# Patient Record
Sex: Female | Born: 1985 | Race: White | Hispanic: No | Marital: Married | Smoking: Former smoker
Health system: Southern US, Community
[De-identification: ages and names within clinical notes are randomized; demographics above are authoritative.]

## PROBLEM LIST (undated history)

## (undated) DIAGNOSIS — Z789 Other specified health status: Secondary | ICD-10-CM

## (undated) DIAGNOSIS — L0591 Pilonidal cyst without abscess: Secondary | ICD-10-CM

## (undated) HISTORY — PX: NO PAST SURGERIES: SHX2092

## (undated) HISTORY — DX: Pilonidal cyst without abscess: L05.91

## (undated) HISTORY — PX: OTHER SURGICAL HISTORY: SHX169

---

## 2009-08-26 ENCOUNTER — Inpatient Hospital Stay: Payer: Self-pay

## 2013-02-09 ENCOUNTER — Ambulatory Visit: Payer: Self-pay | Admitting: Emergency Medicine

## 2013-02-10 ENCOUNTER — Ambulatory Visit: Payer: Self-pay | Admitting: Emergency Medicine

## 2013-06-08 ENCOUNTER — Ambulatory Visit: Payer: Self-pay | Admitting: Physician Assistant

## 2014-01-01 IMAGING — US US SOFT TISSUE EXCLUDE HEAD/NECK
1 series · 14 of 25 positions shown · non-contrast
Comparison: none

REASON FOR EXAM: PALPABLE MASS IN RLQ CAN BE FELT
COMMENTS:

[Series 1: us soft tissue exclude head/neck · 0.08mm/px · 14 of 39 slices shown]
[im 1/39]
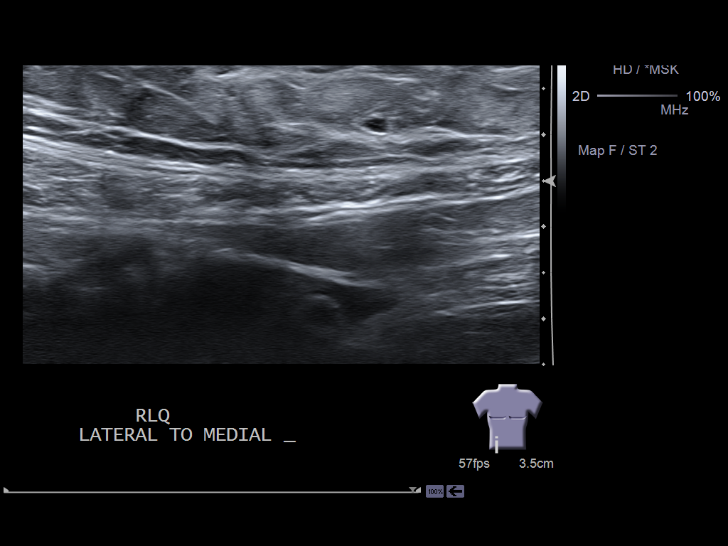
[im 4/39]
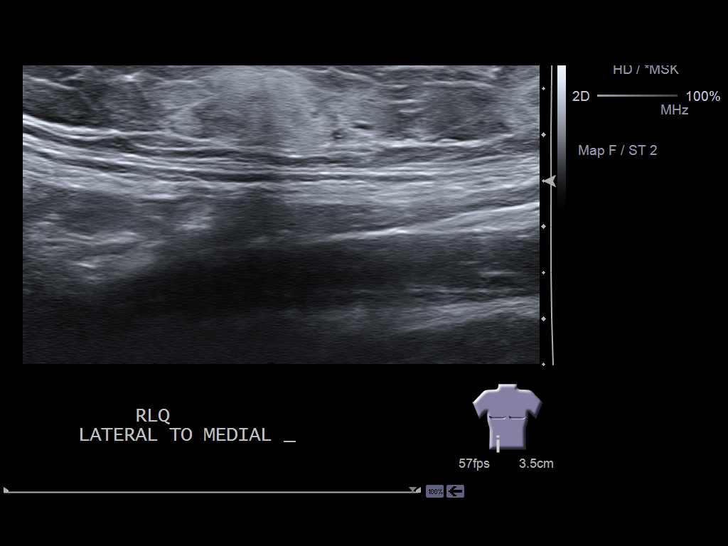
[im 7/39]
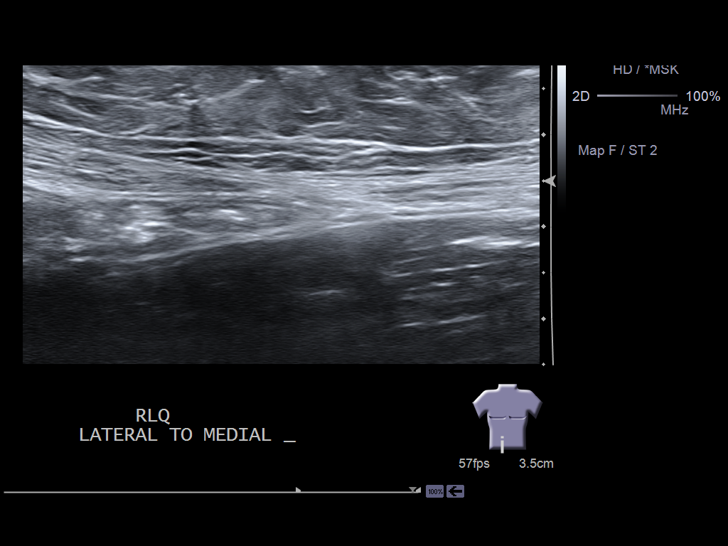
[im 10/39]
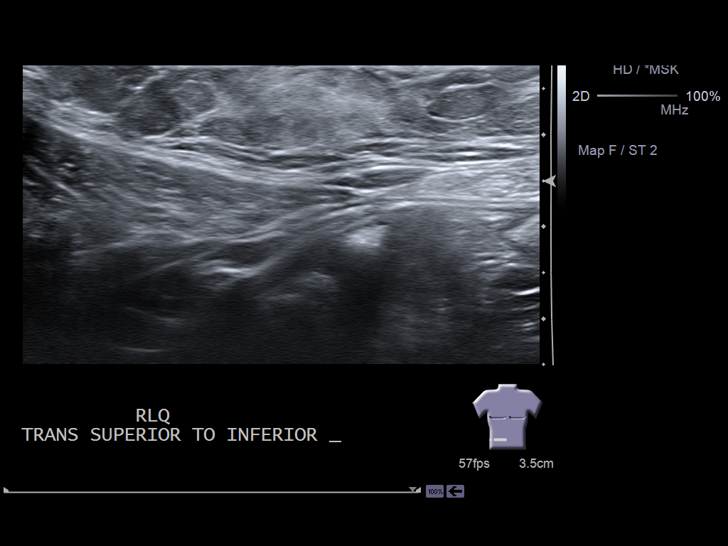
[im 13/39]
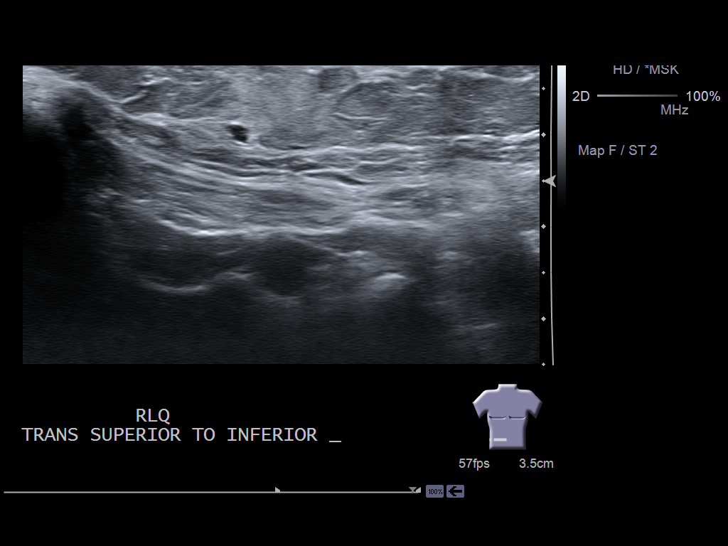
[im 15/39]
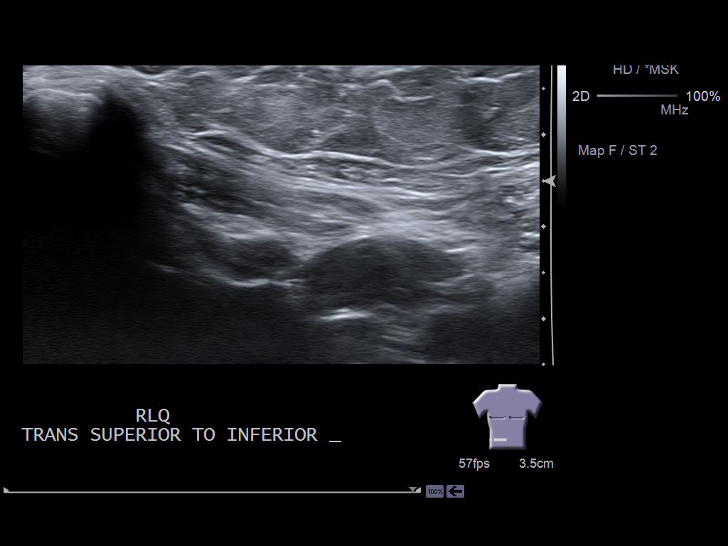
[im 18/39]
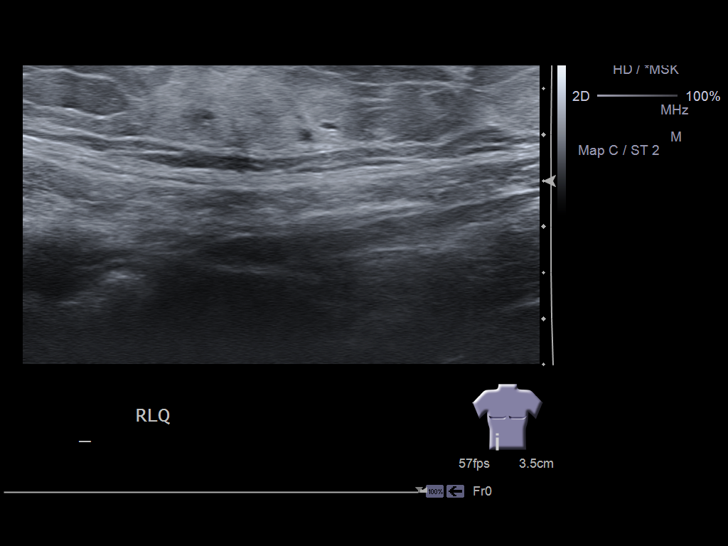
[im 21/39]
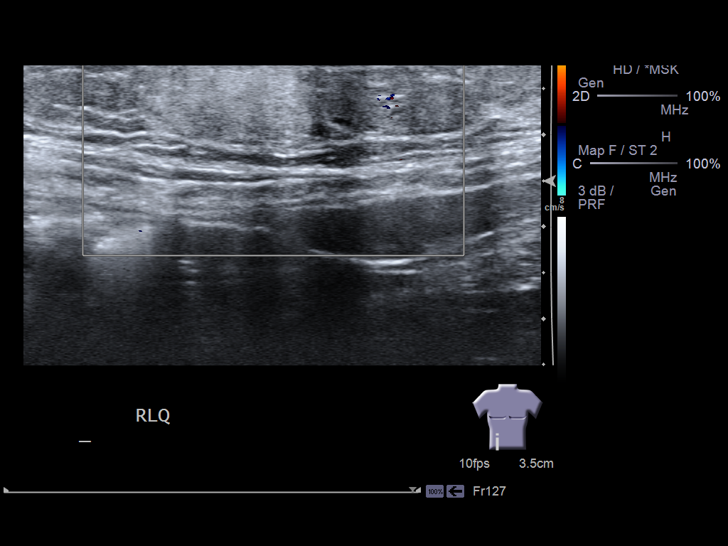
[im 24/39]
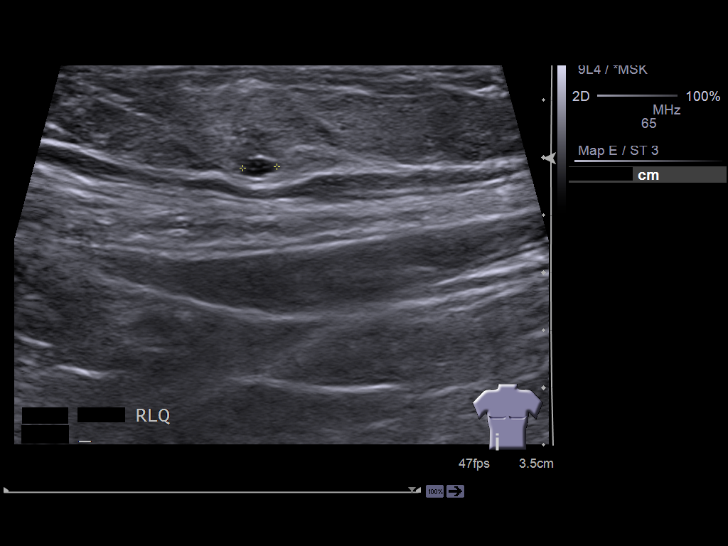
[im 26/39]
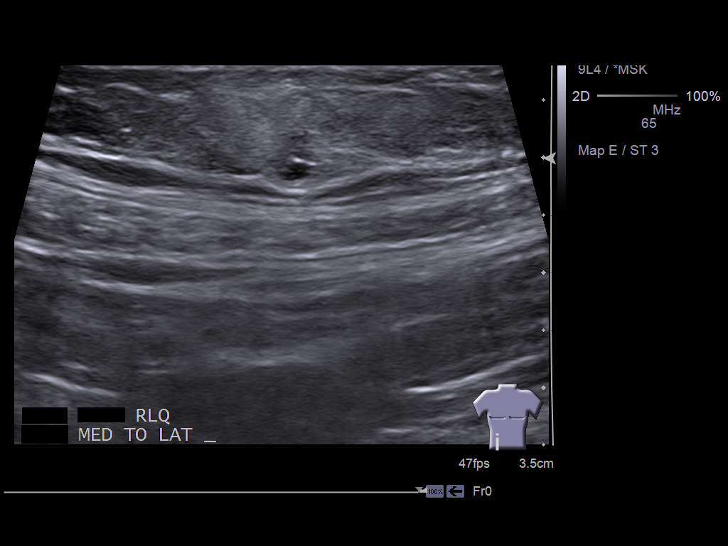
[im 29/39]
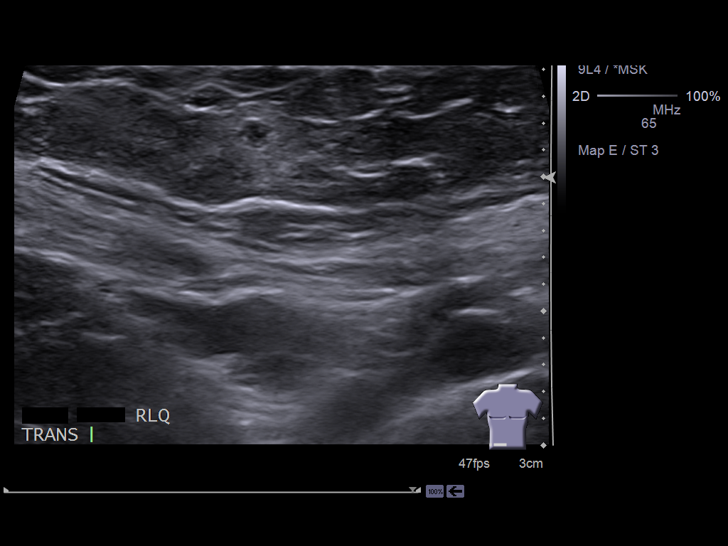
[im 32/39]
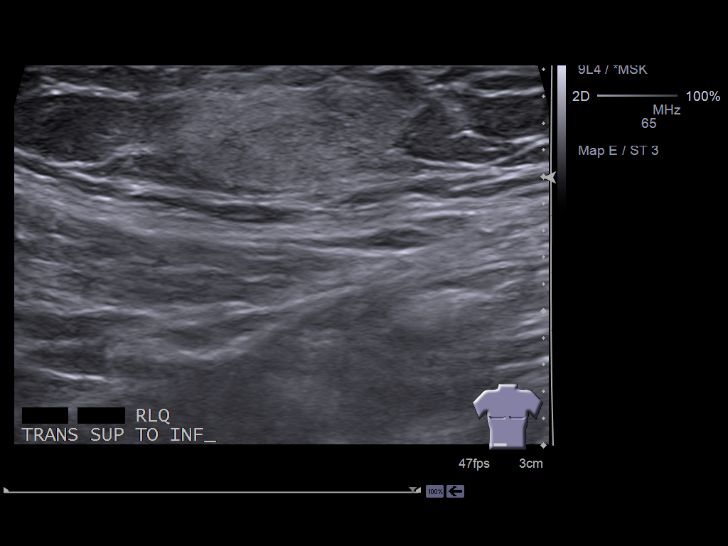
[im 35/39]
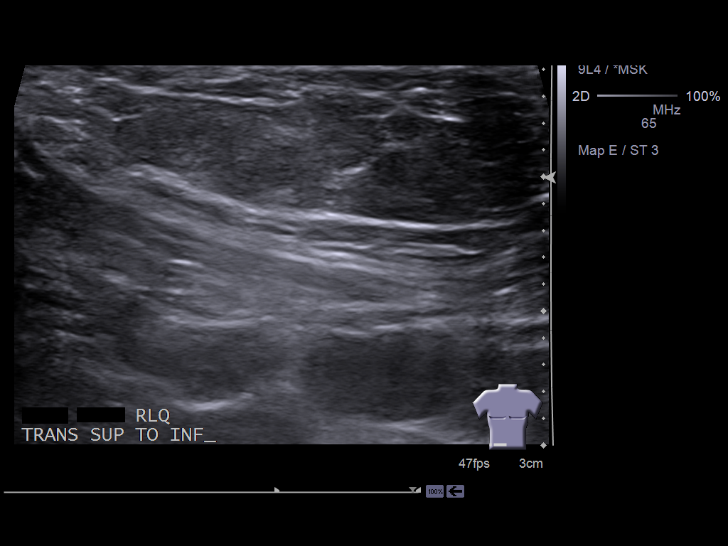
[im 39/39]
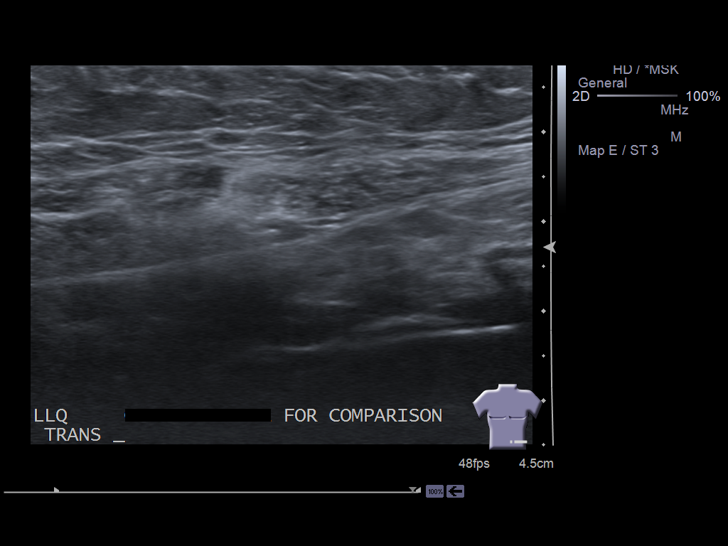

[14 of 25 positions shown; findings below may reference images not displayed]

PROCEDURE:     JIM - JIM SOFT TISSUE NOT HEAD/NECK  - February 10, 2013  [DATE]

RESULT:     Targeted ultrasound in the probable area of abnormality in the
right lower quadrant medial to the anterior/superior iliac spines shows a
poorly defined area of minimally increased echotexture in the subcutaneous
tissues with some small hypoechoic appearance within it. This hypoechoic
area measures 3.0 x 2.3 x 2.1 mm. There is no evidence of hyperemia. Other
smaller rounded hypoechoic areas are seen.
IMPRESSION: Nonspecific area of atypical echotexture with some small
round hypoechoic structures within it. No blood flow or hyperemia in this
structure to any significant degree. The appearance is nonspecific.

[REDACTED]

## 2016-07-30 NOTE — L&D Delivery Note (Signed)
Delivery Note At 3:51 AM a viable child was delivered via Vaginal, Spontaneous (Presentation:ROA).  APGAR: 8, 9; weight pending.   Placenta status: spontaneous, intact.  Cord: 3VC, nuchal cord x 1, true knot x 1 without complications.  Cord pH: N/A  Anesthesia: none Episiotomy:  none Lacerations:  Small hemostatic anterior labia minora Suture Repair: none Est. Blood Loss (mL):  400mL  Mom to postpartum.  Baby to Couplet care / Skin to Skin.  Katherine Hansen 07/09/2017, 4:01 AM

## 2016-11-19 ENCOUNTER — Encounter: Payer: Self-pay | Admitting: Certified Nurse Midwife

## 2016-11-19 ENCOUNTER — Ambulatory Visit (INDEPENDENT_AMBULATORY_CARE_PROVIDER_SITE_OTHER): Payer: Self-pay | Admitting: Certified Nurse Midwife

## 2016-11-19 VITALS — BP 105/64 | HR 81 | Ht 64.0 in | Wt 141.3 lb

## 2016-11-19 DIAGNOSIS — N912 Amenorrhea, unspecified: Secondary | ICD-10-CM

## 2016-11-19 DIAGNOSIS — Z3201 Encounter for pregnancy test, result positive: Secondary | ICD-10-CM

## 2016-11-19 DIAGNOSIS — Z3A01 Less than 8 weeks gestation of pregnancy: Secondary | ICD-10-CM

## 2016-11-19 DIAGNOSIS — Z3490 Encounter for supervision of normal pregnancy, unspecified, unspecified trimester: Secondary | ICD-10-CM | POA: Insufficient documentation

## 2016-11-19 LAB — POCT URINE PREGNANCY: Preg Test, Ur: POSITIVE — AB

## 2016-11-19 NOTE — Progress Notes (Signed)
Subjective:    Katherine Hansen is a 31 y.o. female who presents for evaluation of amenorrhea. She believes she could be pregnant. Pregnancy is desired. Sexual Activity: single partner, contraception: none. Current symptoms also include: morning sickness and nausea. Last period was normal.   Patient's last menstrual period was 09/28/2016 (exact date). Has hx of irregular periods.  The following portions of the patient's history were reviewed and updated as appropriate: allergies, current medications, past family history, past medical history, past social history, past surgical history and problem list.  Review of Systems Pertinent items are noted in HPI.     Objective:    BP 105/64 (BP Location: Left Arm, Patient Position: Sitting, Cuff Size: Normal)   Pulse 81   Ht  (1.626 m)   Wt 141 lb 4.8 oz (64.1 kg)   LMP 09/28/2016 (Exact Date)   BMI 24.25 kg/m  General: alert, cooperative, appears stated age and no acute distress    Lab Review Urine HCG: positive    Assessment:    Absence of menstruation.     Plan:    Pregnancy Test:   Positive: EDC: 07/05/17. Briefly discussed pre-natal care options. Encouraged well-balanced diet, plenty of rest when needed, pre-natal vitamins daily and walking for exercise. Discussed self-help for nausea, avoiding OTC medications until consulting provider or pharmacist, other than Tylenol as needed, minimal caffeine (1-2 cups daily) and avoiding alcohol. She will schedule and ultrasound for dating due to hx of irregular cycles. Her nurse visit in 3 wks. Her initial OB visit @ 12 wks. . Feel free to call with any questions.

## 2016-11-19 NOTE — Patient Instructions (Signed)
First Trimester of Pregnancy The first trimester of pregnancy is from week 1 until the end of week 13 (months 1 through 3). During this time, your baby will begin to develop inside you. At 6-8 weeks, the eyes and face are formed, and the heartbeat can be seen on ultrasound. At the end of 12 weeks, all the baby's organs are formed. Prenatal care is all the medical care you receive before the birth of your baby. Make sure you get good prenatal care and follow all of your doctor's instructions. Follow these instructions at home: Medicines  Take over-the-counter and prescription medicines only as told by your doctor. Some medicines are safe and some medicines are not safe during pregnancy.  Take a prenatal vitamin that contains at least 600 micrograms (mcg) of folic acid.  If you have trouble pooping (constipation), take medicine that will make your stool soft (stool softener) if your doctor approves. Eating and drinking  Eat regular, healthy meals.  Your doctor will tell you the amount of weight gain that is right for you.  Avoid raw meat and uncooked cheese.  If you feel sick to your stomach (nauseous) or throw up (vomit): ? Eat 4 or 5 small meals a day instead of 3 large meals. ? Try eating a few soda crackers. ? Drink liquids between meals instead of during meals.  To prevent constipation: ? Eat foods that are high in fiber, like fresh fruits and vegetables, whole grains, and beans. ? Drink enough fluids to keep your pee (urine) clear or pale yellow. Activity  Exercise only as told by your doctor. Stop exercising if you have cramps or pain in your lower belly (abdomen) or low back.  Do not exercise if it is too hot, too humid, or if you are in a place of great height (high altitude).  Try to avoid standing for long periods of time. Move your legs often if you must stand in one place for a long time.  Avoid heavy lifting.  Wear low-heeled shoes. Sit and stand up straight.  You  can have sex unless your doctor tells you not to. Relieving pain and discomfort  Wear a good support bra if your breasts are sore.  Take warm water baths (sitz baths) to soothe pain or discomfort caused by hemorrhoids. Use hemorrhoid cream if your doctor says it is okay.  Rest with your legs raised if you have leg cramps or low back pain.  If you have puffy, bulging veins (varicose veins) in your legs: ? Wear support hose or compression stockings as told by your doctor. ? Raise (elevate) your feet for 15 minutes, 3-4 times a day. ? Limit salt in your food. Prenatal care  Schedule your prenatal visits by the twelfth week of pregnancy.  Write down your questions. Take them to your prenatal visits.  Keep all your prenatal visits as told by your doctor. This is important. Safety  Wear your seat belt at all times when driving.  Make a list of emergency phone numbers. The list should include numbers for family, friends, the hospital, and police and fire departments. General instructions  Ask your doctor for a referral to a local prenatal class. Begin classes no later than at the start of month 6 of your pregnancy.  Ask for help if you need counseling or if you need help with nutrition. Your doctor can give you advice or tell you where to go for help.  Do not use hot tubs, steam rooms, or   saunas.  Do not douche or use tampons or scented sanitary pads.  Do not cross your legs for long periods of time.  Avoid all herbs and alcohol. Avoid drugs that are not approved by your doctor.  Do not use any tobacco products, including cigarettes, chewing tobacco, and electronic cigarettes. If you need help quitting, ask your doctor. You may get counseling or other support to help you quit.  Avoid cat litter boxes and soil used by cats. These carry germs that can cause birth defects in the baby and can cause a loss of your baby (miscarriage) or stillbirth.  Visit your dentist. At home, brush  your teeth with a soft toothbrush. Be gentle when you floss. Contact a doctor if:  You are dizzy.  You have mild cramps or pressure in your lower belly.  You have a nagging pain in your belly area.  You continue to feel sick to your stomach, you throw up, or you have watery poop (diarrhea).  You have a bad smelling fluid coming from your vagina.  You have pain when you pee (urinate).  You have increased puffiness (swelling) in your face, hands, legs, or ankles. Get help right away if:  You have a fever.  You are leaking fluid from your vagina.  You have spotting or bleeding from your vagina.  You have very bad belly cramping or pain.  You gain or lose weight rapidly.  You throw up blood. It may look like coffee grounds.  You are around people who have German measles, fifth disease, or chickenpox.  You have a very bad headache.  You have shortness of breath.  You have any kind of trauma, such as from a fall or a car accident. Summary  The first trimester of pregnancy is from week 1 until the end of week 13 (months 1 through 3).  To take care of yourself and your unborn baby, you will need to eat healthy meals, take medicines only if your doctor tells you to do so, and do activities that are safe for you and your baby.  Keep all follow-up visits as told by your doctor. This is important as your doctor will have to ensure that your baby is healthy and growing well. This information is not intended to replace advice given to you by your health care provider. Make sure you discuss any questions you have with your health care provider. Document Released: 01/02/2008 Document Revised: 07/24/2016 Document Reviewed: 07/24/2016 [employer] Interactive Patient Education  2017 [employer]  

## 2016-11-23 ENCOUNTER — Other Ambulatory Visit: Payer: Self-pay | Admitting: Certified Nurse Midwife

## 2016-11-23 DIAGNOSIS — Z369 Encounter for antenatal screening, unspecified: Secondary | ICD-10-CM

## 2016-11-26 ENCOUNTER — Other Ambulatory Visit: Payer: Self-pay

## 2016-11-26 ENCOUNTER — Ambulatory Visit (INDEPENDENT_AMBULATORY_CARE_PROVIDER_SITE_OTHER)

## 2016-11-26 DIAGNOSIS — Z369 Encounter for antenatal screening, unspecified: Secondary | ICD-10-CM

## 2016-11-26 MED ORDER — DOXYLAMINE-PYRIDOXINE 10-10 MG PO TBEC: 10.0000 mg | DELAYED_RELEASE_TABLET | Freq: Every day | ORAL | 1 refills | Status: DC

## 2016-11-28 ENCOUNTER — Other Ambulatory Visit: Payer: Self-pay

## 2016-11-28 MED ORDER — DOXYLAMINE-PYRIDOXINE 10-10 MG PO TBEC: 10.0000 mg | DELAYED_RELEASE_TABLET | Freq: Every day | ORAL | 1 refills | Status: DC

## 2016-12-10 ENCOUNTER — Ambulatory Visit (INDEPENDENT_AMBULATORY_CARE_PROVIDER_SITE_OTHER): Admitting: Certified Nurse Midwife

## 2016-12-10 VITALS — BP 101/67 | HR 85 | Ht 64.0 in | Wt 145.5 lb

## 2016-12-10 DIAGNOSIS — Z3481 Encounter for supervision of other normal pregnancy, first trimester: Secondary | ICD-10-CM

## 2016-12-10 DIAGNOSIS — Z113 Encounter for screening for infections with a predominantly sexual mode of transmission: Secondary | ICD-10-CM

## 2016-12-10 DIAGNOSIS — Z1389 Encounter for screening for other disorder: Secondary | ICD-10-CM

## 2016-12-10 NOTE — Patient Instructions (Addendum)
Pregnancy and Zika Virus Disease Zika virus disease, or Zika, is an illness that can spread to people from mosquitoes that carry the virus. It may also spread from person to person through infected body fluids. Zika first occurred in [place], but recently it has spread to new areas. The virus occurs in tropical climates. The location of Zika continues to change. Most people who become infected with Zika virus do not develop serious illness. However, Zika may cause birth defects in an unborn baby whose mother is infected with the virus. It may also increase the risk of miscarriage. What are the symptoms of Zika virus disease? In many cases, people who have been infected with Zika virus do not develop any symptoms. If symptoms appear, they usually start about a week after the person is infected. Symptoms are usually mild. They may include:  Fever.  Rash.  Red eyes.  Joint pain. How does Zika virus disease spread? The main way that Zika virus spreads is through the bite of a certain type of mosquito. Unlike most types of mosquitos, which bite only at night, the type of mosquito that carries Zika virus bites both at night and during the day. Zika virus can also spread through sexual contact, through a blood transfusion, and from a mother to her baby before or during birth. Once you have had Zika virus disease, it is unlikely that you will get it again. Can I pass Zika to my baby during pregnancy? Yes, Zika can pass from a mother to her baby before or during birth. What problems can Zika cause for my baby? A woman who is infected with Zika virus while pregnant is at risk of having her baby born with a condition in which the brain or head is smaller than expected (microcephaly). Babies who have microcephaly can have developmental delays, seizures, hearing problems, and vision problems. Having Zika virus disease during pregnancy can also increase the risk of miscarriage. How can Zika virus disease be  prevented? There is no vaccine to prevent Zika. The best way to prevent the disease is to avoid infected mosquitoes and avoid exposure to body fluids that can spread the virus. Avoid any possible exposure to Zika by taking the following precautions. For women and their sex partners:  Avoid traveling to high-risk areas. The locations where Zika is being reported change often. To identify high-risk areas, check the CDC travel website: www.cdc.gov/zika/geo/index.html  If you or your sex partner must travel to a high-risk area, talk with a health care provider before and after traveling.  Take all precautions to avoid mosquito bites if you live in, or travel to, any of the high-risk areas. Insect repellents are safe to use during pregnancy.  Ask your health care provider when it is safe to have sexual contact. For women:  If you are pregnant or trying to become pregnant, avoid sexual contact with persons who may have been exposed to Zika virus, persons who have possible symptoms of Zika, or persons whose history you are unsure about. If you choose to have sexual contact with someone who may have been exposed to Zika virus, use condoms correctly during the entire duration of sexual activity, every time. Do not share sexual devices, as you may be exposed to body fluids.  Ask your health care provider about when it is safe to attempt pregnancy after a possible exposure to Zika virus. What steps should I take to avoid mosquito bites? Take these steps to avoid mosquito bites when you are   in a high-risk area:  Wear loose clothing that covers your arms and legs.  Limit your outdoor activities.  Do not open windows unless they have window screens.  Sleep under mosquito nets.  Use insect repellent. The best insect repellents have:  DEET, picaridin, oil of lemon eucalyptus (OLE), or IR3535 in them.  Higher amounts of an active ingredient in them.  Remember that insect repellents are safe to use  during pregnancy.  Do not use OLE on children who are younger than 3 years of age. Do not use insect repellent on babies who are younger than 2 months of age.  Cover your child's stroller with mosquito netting. Make sure the netting fits snugly and that any loose netting does not cover your child's mouth or nose. Do not use a blanket as a mosquito-protection cover.  Do not apply insect repellent underneath clothing.  If you are using sunscreen, apply the sunscreen before applying the insect repellent.  Treat clothing with permethrin. Do not apply permethrin directly to your skin. Follow label directions for safe use.  Get rid of standing water, where mosquitoes may reproduce. Standing water is often found in items such as buckets, bowls, animal food dishes, and flowerpots. When you return from traveling to any high-risk area, continue taking actions to protect yourself against mosquito bites for 3 weeks, even if you show no signs of illness. This will prevent spreading Zika virus to uninfected mosquitoes. What should I know about the sexual transmission of Zika? People can spread Zika to their sexual partners during vaginal, anal, or oral sex, or by sharing sexual devices. Many people with Zika do not develop symptoms, so a person could spread the disease without knowing that they are infected. The greatest risk is to women who are pregnant or who may become pregnant. Zika virus can live longer in semen than it can live in blood. Couples can prevent sexual transmission of the virus by:  Using condoms correctly during the entire duration of sexual activity, every time. This includes vaginal, anal, and oral sex.  Not sharing sexual devices. Sharing increases your risk of being exposed to body fluid from another person.  Avoiding all sexual activity until your health care provider says it is safe. Should I be tested for Zika virus? A sample of your blood can be tested for Zika virus. A pregnant  woman should be tested if she may have been exposed to the virus or if she has symptoms of Zika. She may also have additional tests done during her pregnancy, such ultrasound testing. Talk with your health care provider about which tests are recommended. This information is not intended to replace advice given to you by your health care provider. Make sure you discuss any questions you have with your health care provider. Document Released: 04/06/2015 Document Revised: 12/22/2015 Document Reviewed: 03/30/2015 Elsevier Interactive Patient Education  2017 [employer] Hyperemesis Gravidarum Hyperemesis gravidarum is a severe form of nausea and vomiting that happens during pregnancy. Hyperemesis is worse than morning sickness. It may cause you to have nausea or vomiting all day for many days. It may keep you from eating and drinking enough food and liquids. Hyperemesis usually occurs during the first half (the first 20 weeks) of pregnancy. It often goes away once a woman is in her second half of pregnancy. However, sometimes hyperemesis continues through an entire pregnancy. What are the causes? The cause of this condition is not known. It may be related to changes in chemicals (hormones)   in the body during pregnancy, such as the high level of pregnancy hormone (human chorionic gonadotropin) or the increase in the female sex hormone (estrogen). What are the signs or symptoms? Symptoms of this condition include:  Severe nausea and vomiting.  Nausea that does not go away.  Vomiting that does not allow you to keep any food down.  Weight loss.  Body fluid loss (dehydration).  Having no desire to eat, or not liking food that you have previously enjoyed. How is this diagnosed? This condition may be diagnosed based on:  A physical exam.  Your medical history.  Your symptoms.  Blood tests.  Urine tests. How is this treated? This condition may be managed with medicine. If medicines to do not  help relieve nausea and vomiting, you may need to receive fluids through an IV tube at the hospital. Follow these instructions at home:  Take over-the-counter and prescription medicines only as told by your health care provider.  Avoid iron pills and multivitamins that contain iron for the first 3-4 months of pregnancy. If you take prescription iron pills, do not stop taking them unless your health care provider approves.  Take the following actions to help prevent nausea and vomiting:  In the morning, before getting out of bed, try eating a couple of dry crackers or a piece of toast.  Avoid foods and smells that upset your stomach. Fatty and spicy foods may make nausea worse.  Eat 5-6 small meals a day.  Do not drink fluids while eating meals. Drink between meals.  Eat or suck on things that have ginger in them. Ginger can help relieve nausea.  Avoid food preparation. The smell of food can spoil your appetite or trigger nausea.  Follow instructions from your health care provider about eating or drinking restrictions.  For snacks, eat high-protein foods, such as cheese.  Keep all follow-up and pre-birth (prenatal) visits as told by your health care provider. This is important. Contact a health care provider if:  You have pain in your abdomen.  You have a severe headache.  You have vision problems.  You are losing weight. Get help right away if:  You cannot drink fluids without vomiting.  You vomit blood.  You have constant nausea and vomiting.  You are very weak.  You are very thirsty.  You feel dizzy.  You faint.  You have a fever or other symptoms that last for more than 2-3 days.  You have a fever and your symptoms suddenly get worse. Summary  Hyperemesis gravidarum is a severe form of nausea and vomiting that happens during pregnancy.  Making some changes to your eating habits may help relieve nausea and vomiting.  This condition may be managed with  medicine.  If medicines to do not help relieve nausea and vomiting, you may need to receive fluids through an IV tube at the hospital. This information is not intended to replace advice given to you by your health care provider. Make sure you discuss any questions you have with your health care provider. Document Released: 07/16/2005 Document Revised: 03/14/2016 Document Reviewed: 03/14/2016 Elsevier Interactive Patient Education  2017 [employer] First Trimester of Pregnancy The first trimester of pregnancy is from week 1 until the end of week 13 (months 1 through 3). During this time, your baby will begin to develop inside you. At 6-8 weeks, the eyes and face are formed, and the heartbeat can be seen on ultrasound. At the end of 12 weeks, all the baby's   organs are formed. Prenatal care is all the medical care you receive before the birth of your baby. Make sure you get good prenatal care and follow all of your doctor's instructions. Follow these instructions at home: Medicines   Take over-the-counter and prescription medicines only as told by your doctor. Some medicines are safe and some medicines are not safe during pregnancy.  Take a prenatal vitamin that contains at least 600 micrograms (mcg) of folic acid.  If you have trouble pooping (constipation), take medicine that will make your stool soft (stool softener) if your doctor approves. Eating and drinking   Eat regular, healthy meals.  Your doctor will tell you the amount of weight gain that is right for you.  Avoid raw meat and uncooked cheese.  If you feel sick to your stomach (nauseous) or throw up (vomit):  Eat 4 or 5 small meals a day instead of 3 large meals.  Try eating a few soda crackers.  Drink liquids between meals instead of during meals.  To prevent constipation:  Eat foods that are high in fiber, like fresh fruits and vegetables, whole grains, and beans.  Drink enough fluids to keep your pee (urine) clear or  pale yellow. Activity   Exercise only as told by your doctor. Stop exercising if you have cramps or pain in your lower belly (abdomen) or low back.  Do not exercise if it is too hot, too humid, or if you are in a place of great height (high altitude).  Try to avoid standing for long periods of time. Move your legs often if you must stand in one place for a long time.  Avoid heavy lifting.  Wear low-heeled shoes. Sit and stand up straight.  You can have sex unless your doctor tells you not to. Relieving pain and discomfort   Wear a good support bra if your breasts are sore.  Take warm water baths (sitz baths) to soothe pain or discomfort caused by hemorrhoids. Use hemorrhoid cream if your doctor says it is okay.  Rest with your legs raised if you have leg cramps or low back pain.  If you have puffy, bulging veins (varicose veins) in your legs:  Wear support hose or compression stockings as told by your doctor.  Raise (elevate) your feet for 15 minutes, 3-4 times a day.  Limit salt in your food. Prenatal care   Schedule your prenatal visits by the twelfth week of pregnancy.  Write down your questions. Take them to your prenatal visits.  Keep all your prenatal visits as told by your doctor. This is important. Safety   Wear your seat belt at all times when driving.  Make a list of emergency phone numbers. The list should include numbers for family, friends, the hospital, and police and fire departments. General instructions   Ask your doctor for a referral to a local prenatal class. Begin classes no later than at the start of month 6 of your pregnancy.  Ask for help if you need counseling or if you need help with nutrition. Your doctor can give you advice or tell you where to go for help.  Do not use hot tubs, steam rooms, or saunas.  Do not douche or use tampons or scented sanitary pads.  Do not cross your legs for long periods of time.  Avoid all herbs and alcohol.  Avoid drugs that are not approved by your doctor.  Do not use any tobacco products, including cigarettes, chewing tobacco, and electronic cigarettes.   If you need help quitting, ask your doctor. You may get counseling or other support to help you quit.  Avoid cat litter boxes and soil used by cats. These carry germs that can cause birth defects in the baby and can cause a loss of your baby (miscarriage) or stillbirth.  Visit your dentist. At home, brush your teeth with a soft toothbrush. Be gentle when you floss. Contact a doctor if:  You are dizzy.  You have mild cramps or pressure in your lower belly.  You have a nagging pain in your belly area.  You continue to feel sick to your stomach, you throw up, or you have watery poop (diarrhea).  You have a bad smelling fluid coming from your vagina.  You have pain when you pee (urinate).  You have increased puffiness (swelling) in your face, hands, legs, or ankles. Get help right away if:  You have a fever.  You are leaking fluid from your vagina.  You have spotting or bleeding from your vagina.  You have very bad belly cramping or pain.  You gain or lose weight rapidly.  You throw up blood. It may look like coffee grounds.  You are around people who have German measles, fifth disease, or chickenpox.  You have a very bad headache.  You have shortness of breath.  You have any kind of trauma, such as from a fall or a car accident. Summary  The first trimester of pregnancy is from week 1 until the end of week 13 (months 1 through 3).  To take care of yourself and your unborn baby, you will need to eat healthy meals, take medicines only if your doctor tells you to do so, and do activities that are safe for you and your baby.  Keep all follow-up visits as told by your doctor. This is important as your doctor will have to ensure that your baby is healthy and growing well. This information is not intended to replace advice given  to you by your health care provider. Make sure you discuss any questions you have with your health care provider. Document Released: 01/02/2008 Document Revised: 07/24/2016 Document Reviewed: 07/24/2016 Elsevier Interactive Patient Education  2017 [employer] Commonly Asked Questions During Pregnancy  Cats: A parasite can be excreted in cat feces.  To avoid exposure you need to have another person empty the little box.  If you must empty the litter box you will need to wear gloves.  Wash your hands after handling your cat.  This parasite can also be found in raw or undercooked meat so this should also be avoided.  Colds, Sore Throats, Flu: Please check your medication sheet to see what you can take for symptoms.  If your symptoms are unrelieved by these medications please call the office.  Dental Work: Most any dental work your dentist recommends is permitted.  X-rays should only be taken during the first trimester if absolutely necessary.  Your abdomen should be shielded with a lead apron during all x-rays.  Please notify your provider prior to receiving any x-rays.  Novocaine is fine; gas is not recommended.  If your dentist requires a note from us prior to dental work please call the office and we will provide one for you.  Exercise: Exercise is an important part of staying healthy during your pregnancy.  You may continue most exercises you were accustomed to prior to pregnancy.  Later in your pregnancy you will most likely notice you have difficulty with activities   requiring balance like riding a bicycle.  It is important that you listen to your body and avoid activities that put you at a higher risk of falling.  Adequate rest and staying well hydrated are a must!  If you have questions about the safety of specific activities ask your provider.    Exposure to Children with illness: Try to avoid obvious exposure; report any symptoms to us when noted,  If you have chicken pos, red measles or mumps,  you should be immune to these diseases.   Please do not take any vaccines while pregnant unless you have checked with your OB provider.  Fetal Movement: After 28 weeks we recommend you do "kick counts" twice daily.  Lie or sit down in a calm quiet environment and count your baby movements "kicks".  You should feel your baby at least 10 times per hour.  If you have not felt 10 kicks within the first hour get up, walk around and have something sweet to eat or drink then repeat for an additional hour.  If count remains less than 10 per hour notify your provider.  Fumigating: Follow your pest control agent's advice as to how long to stay out of your home.  Ventilate the area well before re-entering.  Hemorrhoids:   Most over-the-counter preparations can be used during pregnancy.  Check your medication to see what is safe to use.  It is important to use a stool softener or fiber in your diet and to drink lots of liquids.  If hemorrhoids seem to be getting worse please call the office.   Hot Tubs:  Hot tubs Jacuzzis and saunas are not recommended while pregnant.  These increase your internal body temperature and should be avoided.  Intercourse:  Sexual intercourse is safe during pregnancy as long as you are comfortable, unless otherwise advised by your provider.  Spotting may occur after intercourse; report any bright red bleeding that is heavier than spotting.  Labor:  If you know that you are in labor, please go to the hospital.  If you are unsure, please call the office and let us help you decide what to do.  Lifting, straining, etc:  If your job requires heavy lifting or straining please check with your provider for any limitations.  Generally, you should not lift items heavier than that you can lift simply with your hands and arms (no back muscles)  Painting:  Paint fumes do not harm your pregnancy, but may make you ill and should be avoided if possible.  Latex or water based paints have less odor  than oils.  Use adequate ventilation while painting.  Permanents & Hair Color:  Chemicals in hair dyes are not recommended as they cause increase hair dryness which can increase hair loss during pregnancy.  " Highlighting" and permanents are allowed.  Dye may be absorbed differently and permanents may not hold as well during pregnancy.  Sunbathing:  Use a sunscreen, as skin burns easily during pregnancy.  Drink plenty of fluids; avoid over heating.  Tanning Beds:  Because their possible side effects are still unknown, tanning beds are not recommended.  Ultrasound Scans:  Routine ultrasounds are performed at approximately 20 weeks.  You will be able to see your baby's general anatomy an if you would like to know the gender this can usually be determined as well.  If it is questionable when you conceived you may also receive an ultrasound early in your pregnancy for dating purposes.  Otherwise ultrasound exams   are not routinely performed unless there is a medical necessity.  Although you can request a scan we ask that you pay for it when conducted because insurance does not cover " patient request" scans.  Work: If your pregnancy proceeds without complications you may work until your due date, unless your physician or employer advises otherwise.  Round Ligament Pain/Pelvic Discomfort:  Sharp, shooting pains not associated with bleeding are fairly common, usually occurring in the second trimester of pregnancy.  They tend to be worse when standing up or when you remain standing for long periods of time.  These are the result of pressure of certain pelvic ligaments called "round ligaments".  Rest, Tylenol and heat seem to be the most effective relief.  As the womb and fetus grow, they rise out of the pelvis and the discomfort improves.  Please notify the office if your pain seems different than that described.  It may represent a more serious condition.   

## 2016-12-10 NOTE — Progress Notes (Signed)
Mickel CrowKirsten N Pacella presents for NOB nurse interview visit. Pregnancy confirmation done on 11/19/2016 by A. Janee Mornhompson, CNM, UPT: Positive.  G-3. P-2002. Ultrasound done on 11/26/2016 resulting in 8.1wk, EDD: 07/07/2017 which is consistant with LMP: 09/28/2016.  Pregnancy education material explained and given. No cats in the home. NOB labs ordered.  HIV labs and Drug screen were explained optional and she did not decline. Drug screen ordered. PNV encouraged. Pt not having any nausea today and hopes this is over with. Diclegsis, pt insurance would not cover after prior authorization. Will send in Zofran if needed and pt will contact office if needed.  Genetic screening options discussed. Genetic testing: Unsure.  Pt may discuss with provider. Pt. To follow up with provider in 2 weeks for NOB physical.  All questions answered.

## 2016-12-11 LAB — NICOTINE SCREEN, URINE: Cotinine Ql Scrn, Ur: NEGATIVE ng/mL

## 2016-12-11 LAB — MONITOR DRUG PROFILE 14(MW)
Amphetamine Scrn, Ur: NEGATIVE ng/mL
BARBITURATE SCREEN URINE: NEGATIVE ng/mL
BENZODIAZEPINE SCREEN, URINE: NEGATIVE ng/mL
Buprenorphine, Urine: NEGATIVE ng/mL
CANNABINOIDS UR QL SCN: NEGATIVE ng/mL
COCAINE(METAB.)SCREEN, URINE: NEGATIVE ng/mL
CREATININE(CRT), U: 67.5 mg/dL (ref 20.0–300.0)
Fentanyl, Urine: NEGATIVE pg/mL
MEPERIDINE SCREEN, URINE: NEGATIVE ng/mL
METHADONE SCREEN, URINE: NEGATIVE ng/mL
OXYCODONE+OXYMORPHONE UR QL SCN: NEGATIVE ng/mL
Opiate Scrn, Ur: NEGATIVE ng/mL
PROPOXYPHENE SCREEN URINE: NEGATIVE ng/mL
Ph of Urine: 7.9 (ref 4.5–8.9)
Phencyclidine Qn, Ur: NEGATIVE ng/mL
SPECIFIC GRAVITY: 1.017
TRAMADOL SCREEN, URINE: NEGATIVE ng/mL

## 2016-12-11 LAB — CBC WITH DIFFERENTIAL/PLATELET
BASOS ABS: 0 10*3/uL (ref 0.0–0.2)
Basos: 0 %
EOS (ABSOLUTE): 0.2 10*3/uL (ref 0.0–0.4)
EOS: 2 %
HEMATOCRIT: 37.7 % (ref 34.0–46.6)
HEMOGLOBIN: 12.6 g/dL (ref 11.1–15.9)
IMMATURE GRANS (ABS): 0 10*3/uL (ref 0.0–0.1)
Immature Granulocytes: 0 %
LYMPHS: 17 %
Lymphocytes Absolute: 1.1 10*3/uL (ref 0.7–3.1)
MCH: 29.2 pg (ref 26.6–33.0)
MCHC: 33.4 g/dL (ref 31.5–35.7)
MCV: 88 fL (ref 79–97)
MONOCYTES: 4 %
Monocytes Absolute: 0.3 10*3/uL (ref 0.1–0.9)
NEUTROS ABS: 5 10*3/uL (ref 1.4–7.0)
Neutrophils: 77 %
Platelets: 261 10*3/uL (ref 150–379)
RBC: 4.31 x10E6/uL (ref 3.77–5.28)
RDW: 13 % (ref 12.3–15.4)
WBC: 6.6 10*3/uL (ref 3.4–10.8)

## 2016-12-11 LAB — URINALYSIS, ROUTINE W REFLEX MICROSCOPIC
BILIRUBIN UA: NEGATIVE
GLUCOSE, UA: NEGATIVE
KETONES UA: NEGATIVE
Leukocytes, UA: NEGATIVE
Nitrite, UA: NEGATIVE
Protein, UA: NEGATIVE
RBC UA: NEGATIVE
SPEC GRAV UA: 1.015 (ref 1.005–1.030)
UUROB: 0.2 mg/dL (ref 0.2–1.0)
pH, UA: 8 — ABNORMAL HIGH (ref 5.0–7.5)

## 2016-12-11 LAB — ANTIBODY SCREEN: ANTIBODY SCREEN: NEGATIVE

## 2016-12-11 LAB — VARICELLA ZOSTER ANTIBODY, IGG: Varicella zoster IgG: 765 index (ref >165)

## 2016-12-11 LAB — RH TYPE: Rh Factor: POSITIVE

## 2016-12-11 LAB — HIV ANTIBODY (ROUTINE TESTING W REFLEX): HIV SCREEN 4TH GENERATION: NONREACTIVE

## 2016-12-11 LAB — RUBELLA SCREEN: Rubella Antibodies, IGG: 1.03 index (ref >0.99)

## 2016-12-11 LAB — ABO

## 2016-12-11 LAB — GC/CHLAMYDIA PROBE AMP
Chlamydia trachomatis, NAA: NEGATIVE
Neisseria gonorrhoeae by PCR: NEGATIVE

## 2016-12-11 LAB — HEPATITIS B SURFACE ANTIGEN: Hepatitis B Surface Ag: NEGATIVE

## 2016-12-11 LAB — RPR: RPR: NONREACTIVE

## 2016-12-13 LAB — URINE CULTURE, OB REFLEX

## 2016-12-13 LAB — CULTURE, OB URINE

## 2016-12-25 ENCOUNTER — Ambulatory Visit (INDEPENDENT_AMBULATORY_CARE_PROVIDER_SITE_OTHER): Admitting: Certified Nurse Midwife

## 2016-12-25 ENCOUNTER — Other Ambulatory Visit: Payer: Self-pay | Admitting: Certified Nurse Midwife

## 2016-12-25 ENCOUNTER — Ambulatory Visit (INDEPENDENT_AMBULATORY_CARE_PROVIDER_SITE_OTHER)

## 2016-12-25 ENCOUNTER — Encounter: Payer: Self-pay | Admitting: Certified Nurse Midwife

## 2016-12-25 ENCOUNTER — Ambulatory Visit

## 2016-12-25 VITALS — BP 111/63 | HR 79 | Wt 144.3 lb

## 2016-12-25 DIAGNOSIS — Z3481 Encounter for supervision of other normal pregnancy, first trimester: Secondary | ICD-10-CM | POA: Diagnosis not present

## 2016-12-25 DIAGNOSIS — R8299 Other abnormal findings in urine: Secondary | ICD-10-CM

## 2016-12-25 DIAGNOSIS — R82998 Other abnormal findings in urine: Secondary | ICD-10-CM

## 2016-12-25 DIAGNOSIS — Z124 Encounter for screening for malignant neoplasm of cervix: Secondary | ICD-10-CM

## 2016-12-25 LAB — POCT URINALYSIS DIPSTICK
BILIRUBIN UA: NEGATIVE
Blood, UA: NEGATIVE
Glucose, UA: NEGATIVE
Ketones, UA: NEGATIVE
NITRITE UA: NEGATIVE
PH UA: 6 (ref 5.0–8.0)
PROTEIN UA: NEGATIVE
Spec Grav, UA: 1.01 (ref 1.010–1.025)
Urobilinogen, UA: 0.2 E.U./dL

## 2016-12-25 NOTE — Progress Notes (Signed)
NEW OB HISTORY AND PHYSICAL  SUBJECTIVE:       Katherine Hansen is a 31 y.o. [id number] female, Patient's last menstrual period was 09/28/2016 (exact date)., Estimated Date of Delivery: 07/05/17, [redacted]w[redacted]d, presents today for establishment of Prenatal Care. She has complains of nausea without vomiting. She is using samples of Bonjesta that is helping.    Gynecologic History Patient's last menstrual period was 09/28/2016 (exact date). Normal Contraception: family planing Last Pap: unknown. Results were: normal  Obstetric History OB History  Gravida Para Term Preterm AB Living  3 2 2     2   SAB TAB Ectopic Multiple Live Births          2    # Outcome Date GA Lbr Len/2nd Weight Sex Delivery Anes PTL Lv  3 Current           2 Term 08/27/09 [redacted]w[redacted]d  8 lb 9 oz (3.884 kg) M Vag-Spont None  LIV  1 Term 02/18/07 [redacted]w[redacted]d  7 lb 4 oz (3.289 kg) F Vag-Spont EPI  LIV      No past medical history on file.  Past Surgical History:  Procedure Laterality Date  . none      Current Outpatient Prescriptions on File Prior to Visit  Medication Sig Dispense Refill  . Doxylamine-Pyridoxine 10-10 MG TBEC Take 10 mg by mouth at bedtime. 60 tablet 1  . Prenatal Vit-Fe Fumarate-FA (MULTIVITAMIN-PRENATAL) 27-0.8 MG TABS tablet Take 1 tablet by mouth daily at 12 noon.     No current facility-administered medications on file prior to visit.     Allergies  Allergen Reactions  . Septra [Sulfamethoxazole-Trimethoprim]   . Tetracycline Swelling    Social History   Social History  . Marital status: Married    Spouse name: N/A  . Number of children: N/A  . Years of education: N/A   Occupational History  . Not on file.   Social History Main Topics  . Smoking status: Former Games developer  . Smokeless tobacco: Never Used  . Alcohol use No  . Drug use: No  . Sexual activity: Yes    Birth control/ protection: None   Other Topics Concern  . Not on file   Social History Narrative  . No narrative on file     Family History  Problem Relation Age of Onset  . Migraines Mother   . Rheum arthritis Maternal Grandmother   . Migraines Maternal Grandmother   . Rashes / Skin problems Daughter        Rudie Meyer    The following portions of the patient's history were reviewed and updated as appropriate: allergies, current medications, past OB history, past medical history, past surgical history, past family history, past social history, and problem list.    OBJECTIVE: Initial Physical Exam (New OB)  GENERAL APPEARANCE: alert, well appearing, in no apparent distress, oriented to person, place and time HEAD: normocephalic, atraumatic MOUTH: mucous membranes moist, pharynx normal without lesions THYROID: no thyromegaly or masses present BREASTS: no masses noted, no significant tenderness, no palpable axillary nodes, no skin changes LUNGS: clear to auscultation, no wheezes, rales or rhonchi, symmetric air entry HEART: regular rate and rhythm, no murmurs ABDOMEN: soft, nontender, nondistended, no abnormal masses, no epigastric pain EXTREMITIES: no redness or tenderness in the calves or thighs SKIN: normal coloration and turgor, no rashes LYMPH NODES: no adenopathy palpable NEUROLOGIC: alert, oriented, normal speech, no focal findings or movement disorder noted  PELVIC EXAM EXTERNAL GENITALIA: normal appearing vulva with no masses,  tenderness or lesions VAGINA: no abnormal discharge or lesions CERVIX: no lesions or cervical motion tenderness, contact bleeding UTERUS: gravid ADNEXA: no masses palpable and nontender OB EXAM PELVIMETRY: appears adequate RECTUM: exam not indicated  ASSESSMENT: Normal pregnancy  PLAN: New OB counseling: Will follow up with pap results.  The patient has been given an overview regarding routine prenatal care. Recommendations regarding diet, weight gain, and exercise in pregnancy were given. Prenatal testing, optional genetic testing, and ultrasound use in pregnancy  were reviewed. Benefits of Breast Feeding were discussed. The patient is encouraged to consider nursing her baby post partum.  Doreene BurkeAnnie Orville Widmann, CNM

## 2016-12-25 NOTE — Addendum Note (Signed)
Addended by: Jackquline DenmarkIDGEWAY, Nevan Creighton W on: 12/25/2016 12:28 PM   Modules accepted: Orders

## 2016-12-25 NOTE — Patient Instructions (Signed)

## 2016-12-25 NOTE — Addendum Note (Signed)
Addended by: Jackquline DenmarkIDGEWAY, Carnell Beavers W on: 12/25/2016 02:11 PM   Modules accepted: Orders

## 2016-12-27 LAB — FIRST TRIMESTER SCREEN W/NT
CRL: 57.3 mm
DIA MOM: 0.69
DIA Value: 179.9 pg/mL
GEST AGE-COLLECT: 12.3 wk
Maternal Age At EDD: 31.8 yr
NUCHAL TRANSLUCENCY MOM: 1.01
NUCHAL TRANSLUCENCY: 1.5 mm
NUMBER OF FETUSES: 1
PAPP-A MoM: 0.8
PAPP-A VALUE: 717.1 ng/mL
Test Results:: NEGATIVE
WEIGHT: 144 [lb_av]
hCG MoM: 1.15
hCG Value: 108.5 IU/mL

## 2016-12-28 ENCOUNTER — Encounter: Payer: Self-pay | Admitting: Certified Nurse Midwife

## 2016-12-28 LAB — PAP IG AND HPV HIGH-RISK
HPV, HIGH-RISK: NEGATIVE
PAP Smear Comment: 0

## 2016-12-30 LAB — URINE CULTURE, OB REFLEX

## 2016-12-30 LAB — CULTURE, OB URINE

## 2016-12-31 ENCOUNTER — Encounter: Payer: Self-pay | Admitting: Certified Nurse Midwife

## 2016-12-31 ENCOUNTER — Other Ambulatory Visit: Payer: Self-pay | Admitting: Certified Nurse Midwife

## 2016-12-31 MED ORDER — NITROFURANTOIN MONOHYD MACRO 100 MG PO CAPS: 100.0000 mg | ORAL_CAPSULE | Freq: Two times a day (BID) | ORAL | 0 refills | Status: AC

## 2017-01-01 ENCOUNTER — Encounter: Payer: Self-pay | Admitting: Certified Nurse Midwife

## 2017-01-22 ENCOUNTER — Encounter: Payer: Self-pay | Admitting: Certified Nurse Midwife

## 2017-01-22 ENCOUNTER — Ambulatory Visit (INDEPENDENT_AMBULATORY_CARE_PROVIDER_SITE_OTHER): Admitting: Certified Nurse Midwife

## 2017-01-22 VITALS — BP 112/69 | HR 81 | Wt 145.7 lb

## 2017-01-22 DIAGNOSIS — R8299 Other abnormal findings in urine: Secondary | ICD-10-CM

## 2017-01-22 DIAGNOSIS — Z3481 Encounter for supervision of other normal pregnancy, first trimester: Secondary | ICD-10-CM

## 2017-01-22 DIAGNOSIS — R82998 Other abnormal findings in urine: Secondary | ICD-10-CM

## 2017-01-22 LAB — POCT URINALYSIS DIPSTICK
Bilirubin, UA: NEGATIVE
Blood, UA: NEGATIVE
Glucose, UA: NEGATIVE
Ketones, UA: NEGATIVE
Nitrite, UA: NEGATIVE
Protein, UA: NEGATIVE
SPEC GRAV UA: 1.015 (ref 1.010–1.025)
UROBILINOGEN UA: 0.2 U/dL
pH, UA: 6.5 (ref 5.0–8.0)

## 2017-01-24 ENCOUNTER — Other Ambulatory Visit: Payer: Self-pay | Admitting: Certified Nurse Midwife

## 2017-01-24 ENCOUNTER — Encounter: Payer: Self-pay | Admitting: Certified Nurse Midwife

## 2017-01-24 LAB — CULTURE, OB URINE

## 2017-01-24 LAB — URINE CULTURE, OB REFLEX

## 2017-01-24 MED ORDER — FLUCONAZOLE 100 MG PO TABS: 100.0000 mg | ORAL_TABLET | Freq: Every day | ORAL | 0 refills | Status: AC

## 2017-01-24 NOTE — Progress Notes (Signed)
ROB-Pt doing well, no questions or concerns. Reviewed red flag symptoms and when to call. RTC x 3-4 weeks for anatomy scan and ROB.

## 2017-02-13 ENCOUNTER — Other Ambulatory Visit: Payer: Self-pay | Admitting: Certified Nurse Midwife

## 2017-02-13 DIAGNOSIS — Z3689 Encounter for other specified antenatal screening: Secondary | ICD-10-CM

## 2017-02-14 ENCOUNTER — Ambulatory Visit (INDEPENDENT_AMBULATORY_CARE_PROVIDER_SITE_OTHER): Admitting: Obstetrics and Gynecology

## 2017-02-14 ENCOUNTER — Ambulatory Visit (INDEPENDENT_AMBULATORY_CARE_PROVIDER_SITE_OTHER)

## 2017-02-14 VITALS — BP 107/60 | HR 72 | Wt 148.6 lb

## 2017-02-14 DIAGNOSIS — Z3492 Encounter for supervision of normal pregnancy, unspecified, second trimester: Secondary | ICD-10-CM

## 2017-02-14 DIAGNOSIS — Z3689 Encounter for other specified antenatal screening: Secondary | ICD-10-CM

## 2017-02-14 LAB — POCT URINALYSIS DIPSTICK
Bilirubin, UA: NEGATIVE
Blood, UA: NEGATIVE
Glucose, UA: NEGATIVE
KETONES UA: NEGATIVE
Leukocytes, UA: NEGATIVE
Nitrite, UA: NEGATIVE
PH UA: 6 (ref 5.0–8.0)
PROTEIN UA: NEGATIVE
SPEC GRAV UA: 1.015 (ref 1.010–1.025)
Urobilinogen, UA: 0.2 E.U./dL

## 2017-02-14 NOTE — Progress Notes (Signed)
ROB- anatomy scan done today, pt is doing well, doesn't want to know sex of baby

## 2017-02-14 NOTE — Progress Notes (Signed)
ndications: Anatomy Findings:  Singleton intrauterine pregnancy is visualized with FHR at 147 BPM. Biometrics give an (U/S) Gestational age of [redacted] weeks 4 days, and an (U/S) EDD of 07/07/17; this correlates with the clinically established EDD of 07/05/17.  Fetal presentation is vertex, spine variable.  EFW: 311 grams ( 0 lbs. 11 oz) Placenta: Anterior, grade 1 with a small 1.4 cm placental lake seen. Placenta is remote to cervix at 5.6 cm. AFI: Subjectively adequate with an MVP of 3.0 cm.  Anatomic survey is complete and appears WNL. Gender - A SURPRISE until August 5th!!!!   Right ovary is not visualized.  Left Ovary measures 3.3 x 1.4 x 2.7 cm, and appears WNL. There is no obvious evidence of a corpus luteal cyst. Survey of the adnexa demonstrates no adnexal masses. There is no free peritoneal fluid in the cul de sac.  Impression: 1. 19 week 4 day Viable Singleton Intrauterine pregnancy by U/S. 2. (U/S) EDD is consistent with Clinically established (LMP) EDD of 07/05/17. 3. Normal appearing anatomy scan.

## 2017-02-19 ENCOUNTER — Telehealth: Payer: Self-pay | Admitting: Obstetrics and Gynecology

## 2017-02-19 ENCOUNTER — Ambulatory Visit
Admission: EM | Admit: 2017-02-19 | Discharge: 2017-02-19 | Disposition: A | Discharge status: Home / Self Care | Attending: Family Medicine | Admitting: Family Medicine

## 2017-02-19 DIAGNOSIS — H6981 Other specified disorders of Eustachian tube, right ear: Secondary | ICD-10-CM | POA: Diagnosis not present

## 2017-02-19 DIAGNOSIS — H9201 Otalgia, right ear: Secondary | ICD-10-CM

## 2017-02-19 MED ORDER — MONTELUKAST SODIUM 10 MG PO TABS: 10.0000 mg | ORAL_TABLET | Freq: Every day | ORAL | 0 refills | Status: DC

## 2017-02-19 MED ORDER — FLUTICASONE PROPIONATE 50 MCG/ACT NA SUSP: 2.0000 | Freq: Every day | NASAL | 0 refills | Status: DC

## 2017-02-19 MED ORDER — LORATADINE 10 MG PO TABS: 10.0000 mg | ORAL_TABLET | Freq: Every day | ORAL | 0 refills | Status: DC

## 2017-02-19 NOTE — ED Triage Notes (Signed)
Patient complains of right ear pain that started on Sunday. Patient states that she has been using swimmers ear drops without relief. Patient states that she is [redacted] weeks pregnant.

## 2017-02-19 NOTE — Telephone Encounter (Signed)
ERROR

## 2017-02-19 NOTE — ED Provider Notes (Signed)
[facility]    CSN: [id number] Arrival date & time: 02/19/17  1247     History   Chief Complaint Chief Complaint  Patient presents with  . Otalgia    right    HPI Katherine Hansen is a 31 y.o. female.   Patient's here because of pain in the right ear started on Sunday reports cleaning up after one of her children started night and some nadirs started bothering her. She has pain impression the right ear. She hasn't had ear infection in years. No fever she does not smoke about 5 months pregnant. She is gravida 3 para 2. No known drug allergies she does not smoke no pertinent family medical history relevant to today's visit.   The history is provided by the patient.  Otalgia  Location:  Right Quality:  Aching and sharp Severity:  Moderate Onset quality:  Sudden Timing:  Constant Progression:  Worsening Chronicity:  New Relieved by:  Nothing Worsened by:  Nothing Associated symptoms: no ear discharge, no fever, no rhinorrhea and no sore throat   Risk factors: no chronic ear infection     History reviewed. No pertinent past medical history.  Patient Active Problem List   Diagnosis Date Noted  . Pregnancy 11/19/2016    Past Surgical History:  Procedure Laterality Date  . NO PAST SURGERIES    . none      OB History    Gravida Para Term Preterm AB Living   3 2 2     2    SAB TAB Ectopic Multiple Live Births           2       Home Medications    Prior to Admission medications   Medication Sig Start Date End Date Taking? Authorizing Provider  Prenatal Vit-Fe Fumarate-FA (MULTIVITAMIN-PRENATAL) 27-0.8 MG TABS tablet Take 1 tablet by mouth daily at 12 noon.   Yes [provider]  fluticasone (FLONASE) 50 MCG/ACT nasal spray Place 2 sprays into both nostrils daily. Use if eustachian tube dysfunction does not improve in the next 48-96 hours 02/19/17   Hassan Rowan, MD  loratadine (CLARITIN) 10 MG tablet Take 1 tablet (10 mg total) by mouth  daily. Take 1 tablet in the morning. As needed for itching. 02/19/17   Hassan Rowan, MD  montelukast (SINGULAIR) 10 MG tablet Take 1 tablet (10 mg total) by mouth at bedtime. Use if eustachian tube dysfunction does not improve in the next 48-96 hours 02/19/17   Hassan Rowan, MD    Family History Family History  Problem Relation Age of Onset  . Migraines Mother   . Rheum arthritis Maternal Grandmother   . Migraines Maternal Grandmother   . Rashes / Skin problems Daughter        Ezcema    Social History Social History  Substance Use Topics  . Smoking status: Former Games developer  . Smokeless tobacco: Never Used  . Alcohol use No     Allergies   Septra [sulfamethoxazole-trimethoprim] and Tetracycline   Review of Systems Review of Systems  Constitutional: Negative for fever.  HENT: Positive for ear pain. Negative for ear discharge, rhinorrhea and sore throat.   All other systems reviewed and are negative.    Physical Exam Triage Vital Signs ED Triage Vitals  Enc Vitals Group     BP 02/19/17 1313 (!) 110/55     Pulse Rate 02/19/17 1313 73     Resp 02/19/17 1313 18     Temp 02/19/17 1313  98.4 F (36.9 C)     Temp Source 02/19/17 1313 Oral     SpO2 02/19/17 1313 100 %     Weight 02/19/17 1312 148 lb (67.1 kg)     Height --      Head Circumference --      Peak Flow --      Pain Score 02/19/17 1312 8     Pain Loc --      Pain Edu? --      Excl. in GC? --    No data found.   Updated Vital Signs BP (!) 110/55 (BP Location: Left Arm)   Pulse 73   Temp 98.4 F (36.9 C) (Oral)   Resp 18   Wt 148 lb (67.1 kg)   LMP 09/28/2016 (Exact Date)   SpO2 100%   BMI 25.40 kg/m   Visual Acuity Right Eye Distance:   Left Eye Distance:   Bilateral Distance:    Right Eye Near:   Left Eye Near:    Bilateral Near:     Physical Exam  Constitutional: She is oriented to person, place, and time. She appears well-developed and well-nourished.  HENT:  Head: Normocephalic and  atraumatic.  Right Ear: Hearing, tympanic membrane, external ear and ear canal normal.  Left Ear: Hearing, external ear and ear canal normal. Tympanic membrane is injected.  Mouth/Throat: Oropharynx is clear and moist.  The left TM is slightly injected but the right TM is completely clear in the right ear is completely normal in appearance we do a Valsalva maneuver she is able to reproduce discomfort in the right ear and the right ear does not pop  Eyes: Pupils are equal, round, and reactive to light.  Neck: Normal range of motion. Neck supple.  Pulmonary/Chest: Effort normal.  Musculoskeletal: Normal range of motion.  Neurological: She is alert and oriented to person, place, and time.  Skin: Skin is warm.  Psychiatric: She has a normal mood and affect.  Vitals reviewed.    UC Treatments / Results  Labs (all labs ordered are listed, but only abnormal results are displayed) Labs Reviewed - No data to display  EKG  EKG Interpretation None       Radiology No results found.  Procedures Procedures (including critical care time)  Medications Ordered in UC Medications - No data to display   Initial Impression / Assessment and Plan / UC Course  I have reviewed the triage vital signs and the nursing notes.  Pertinent labs & imaging results that were available during my care of the patient were reviewed by me and considered in my medical decision making (see chart for details).     Eustachian tube dysfunction of the right ear. Problem is that she is pregnant and nothing has helped. Going to recommend Claritin 10 mg we went over and looked a pocket tease and shoulder the data on Singulair and Flonase. Those 2% medicines we would not a prescription to use if not better in 48-96 hours if they do not help then 4-5 days may need be reevaluated to make sure this right ear is not Infected.  Final Clinical Impressions(s) / UC Diagnoses   Final diagnoses:  Dysfunction of right  eustachian tube    New Prescriptions Discharge Medication List as of 02/19/2017  2:26 PM    START taking these medications   Details  fluticasone (FLONASE) 50 MCG/ACT nasal spray Place 2 sprays into both nostrils daily. Use if eustachian tube dysfunction does not improve in  the next 48-96 hours, Starting Tue 02/19/2017, Print    loratadine (CLARITIN) 10 MG tablet Take 1 tablet (10 mg total) by mouth daily. Take 1 tablet in the morning. As needed for itching., Starting Tue 02/19/2017, Normal    montelukast (SINGULAIR) 10 MG tablet Take 1 tablet (10 mg total) by mouth at bedtime. Use if eustachian tube dysfunction does not improve in the next 48-96 hours, Starting Tue 02/19/2017, Print        Note: This dictation was prepared with Dragon dictation along with smaller phrase technology. Any transcriptional errors that result from this process are unintentional.   Hassan Rowan, MD 02/19/17 1433

## 2017-03-19 ENCOUNTER — Ambulatory Visit (INDEPENDENT_AMBULATORY_CARE_PROVIDER_SITE_OTHER): Admitting: Obstetrics and Gynecology

## 2017-03-19 VITALS — BP 106/57 | HR 85 | Wt 154.1 lb

## 2017-03-19 DIAGNOSIS — Z3492 Encounter for supervision of normal pregnancy, unspecified, second trimester: Secondary | ICD-10-CM

## 2017-03-19 LAB — POCT URINALYSIS DIPSTICK
Bilirubin, UA: NEGATIVE
Glucose, UA: NEGATIVE
KETONES UA: NEGATIVE
Leukocytes, UA: NEGATIVE
Nitrite, UA: NEGATIVE
PH UA: 6.5 (ref 5.0–8.0)
PROTEIN UA: NEGATIVE
RBC UA: NEGATIVE
SPEC GRAV UA: 1.01 (ref 1.010–1.025)
Urobilinogen, UA: 0.2 E.U./dL

## 2017-03-19 NOTE — Progress Notes (Signed)
ROB- doing well except low back pain and fatigue. Pelvic pains and pubis bone pain-recommended V2 supporter.

## 2017-03-19 NOTE — Progress Notes (Signed)
ROB- pt is doing well 

## 2017-03-25 ENCOUNTER — Encounter: Payer: Self-pay | Admitting: Obstetrics and Gynecology

## 2017-03-26 ENCOUNTER — Encounter: Payer: Self-pay | Admitting: *Deleted

## 2017-04-16 ENCOUNTER — Other Ambulatory Visit

## 2017-04-16 ENCOUNTER — Ambulatory Visit (INDEPENDENT_AMBULATORY_CARE_PROVIDER_SITE_OTHER): Admitting: Certified Nurse Midwife

## 2017-04-16 VITALS — BP 96/70 | HR 86 | Wt 159.5 lb

## 2017-04-16 DIAGNOSIS — Z23 Encounter for immunization: Secondary | ICD-10-CM | POA: Diagnosis not present

## 2017-04-16 DIAGNOSIS — Z131 Encounter for screening for diabetes mellitus: Secondary | ICD-10-CM

## 2017-04-16 DIAGNOSIS — Z3483 Encounter for supervision of other normal pregnancy, third trimester: Secondary | ICD-10-CM | POA: Diagnosis not present

## 2017-04-16 DIAGNOSIS — O99613 Diseases of the digestive system complicating pregnancy, third trimester: Secondary | ICD-10-CM

## 2017-04-16 DIAGNOSIS — Z130 Encounter for screening for diseases of the blood and blood-forming organs and certain disorders involving the immune mechanism: Secondary | ICD-10-CM

## 2017-04-16 DIAGNOSIS — K219 Gastro-esophageal reflux disease without esophagitis: Secondary | ICD-10-CM

## 2017-04-16 LAB — POCT URINALYSIS DIPSTICK
Bilirubin, UA: NEGATIVE
Glucose, UA: NEGATIVE
Ketones, UA: NEGATIVE
NITRITE UA: NEGATIVE
PROTEIN UA: NEGATIVE
SPEC GRAV UA: 1.015 (ref 1.010–1.025)
UROBILINOGEN UA: 0.2 U/dL
pH, UA: 7 (ref 5.0–8.0)

## 2017-04-16 MED ORDER — RANITIDINE HCL 150 MG PO CAPS: 150.0000 mg | ORAL_CAPSULE | Freq: Two times a day (BID) | ORAL | 2 refills | Status: DC

## 2017-04-16 MED ORDER — TETANUS-DIPHTH-ACELL PERTUSSIS 5-2.5-18.5 LF-MCG/0.5 IM SUSP
0.5000 mL | Freq: Once | INTRAMUSCULAR | Status: AC
  Administered 2017-04-16: 0.5 mL via INTRAMUSCULAR

## 2017-04-16 NOTE — Patient Instructions (Signed)
Doren Custard Class  January 09, 2017  Wednesday 7:00p - 9:00p  [place], [place]  February 13, 2017  Wednesday [place], [place]    March 20, 2017   Wednesday [place], [place]  April 17, 2017  Wednesday  [place], [place]  May 15, 2017 Wednesday [place], [place]  Interested in a waterbirth?  This informational class will help you discover whether waterbirth is the right fit for you.  Education about waterbirth itself, supplies you would need and how to assemble your support team is what you can expect from this class.  Some obstetrical practices require this class in order to pursue a waterbirth.  (Not all obstetrical practices offer waterbirth check with your healthcare provider)  Register only the expectant mom, but you are encouraged to bring your partner to class!  Fees & Payment No fee  Register Online [email or web address] Search [place] 2018 Prenatal Education Class Schedule Register at [email or web address] in the [place] or call Kerman Passey at [phone] 9:00a-5:00p M-F  Childbirth Preparation Certified Childbirth Educators teach this 5 week course.  Expectant parents are encouraged to take this class in their 3rd trimester, completing it by their 35-36th week. Meets in [facility], Lower Level.  Mondays Thursdays  7:00-9:00 p 7:00-9:00 p  July 23 - August 20 July 19 - August 16  September 17 - October 15 September 6 -October 4  November 5 - December 3 October 25 - November 29   No Class on Thanksgiving Day -November 22  Childbirth Preparation Refresher Course For those who have previously attended Prepared Childbirth Preparation classes, this class in incorporated into the 3rd and 4th  classes in the Monday night childbirth series.  Course meets in the [facility]. Lower Level from 7:00p - 9:00p  August 6 & 13  October 1 & 8  November 19 & 26   Weekend Childbirth Waymon Amato Classes are held Saturday & Sunday, 1:00 5:00p Course meets in [facility] [place] Level  August 4 & 5  November 3 & 4    The BirthPlace Tours Free tours are held on the third Sunday of each month at 3 pm.  The tour meets in the third floor waiting area and will take approximately 30 minutes.  Tours are also included in Childbirth class series as well as Brother/Sister class.  An online virtual tour can be seen at [email or web address].         Breastfeeding & Infant Nutrition The course incorporates returning to work or school.  Breast milk collection and storage with basic breastfeeding and infant nutrition. This two-class course is held the 2nd and 3rd Tuesday of each month from 7:00 -9:00 pm.  Course meets in the [place] [facility] Lower level  June 12 & 19 July-No Class  August 14 & 21 September 11 &18  September 11 & 18 October 9 &16  November 13 & 20 December 11 & 18   [employer] welcomes any mother for a social outing with other Moms to share experiences and challenges in an informal setting.  Meets the 1st Thursday and 3rd Thursday 11:30a-1:00 pm of each month in [facility] 3rd floor classroom.  No registration required.  Newborn Essentials This course covers bathing, diapering, swaddling and more with practice on lifelike dolls.  Participants  will also learn safety tips and infant CPR (Not for certification).  It is held the 1st Wednesday of each month from 7:00p-9:00p in the [facility], Lower level.  June 6 July- No Class  August 1 September 5  October 3 November 7  December 7    Preparing Big Brother & Sister This one session course prepares children and their parents for the arrival of a new baby.  It is held on the 1st Tuesday of  each month from 6:30p - 8:00p. Course meets in the [facility], Lower level.  July-No Class August 7  September 4 October 2  November 6 December 4   [place] for [employer]  Dads This nationally acclaimed class helps expecting and new dads with the basic skills and confidence to bond with their infants, support their mates, and provide a safe and healthy home environment for their new family. Classes are held the 2nd Saturday of every month from 9:00a - 12:00 noon.  Course meets in the [facility] Lower level.  June 9 August 11  October 13 No Class in December  Vaginal Delivery Vaginal delivery means that you will give birth by pushing your baby out of your birth canal (vagina). A team of health care providers will help you before, during, and after vaginal delivery. Birth experiences are unique for every woman and every pregnancy, and birth experiences vary depending on where you choose to give birth. What should I do to prepare for my baby's birth? Before your baby is born, it is important to talk with your health care provider about:  Your labor and delivery preferences. These may include: ? Medicines that you may be given. ? How you will manage your pain. This might include non-medical pain relief techniques or injectable pain relief such as epidural analgesia. ? How you and your baby will be monitored during labor and delivery. ? Who may be in the labor and delivery room with you. ? Your feelings about surgical delivery of your baby (cesarean delivery, or C-section) if this becomes necessary. ? Your feelings about receiving donated blood through an IV tube (blood transfusion) if this becomes necessary.  Whether you are able: ? To take pictures or videos of the birth. ? To eat during labor and delivery. ? To move around, walk, or change positions during labor and delivery.  What to expect after your baby is born, such as: ? Whether delayed umbilical cord clamping and  cutting is offered. ? Who will care for your baby right after birth. ? Medicines or tests that may be recommended for your baby. ? Whether breastfeeding is supported in your hospital or birth center. ? How long you will be in the hospital or birth center.  How any medical conditions you have may affect your baby or your labor and delivery experience.  To prepare for your baby's birth, you should also:  Attend all of your health care visits before delivery (prenatal visits) as recommended by your health care provider. This is important.  Prepare your home for your baby's arrival. Make sure that you have: ? Diapers. ? Baby clothing. ? Feeding equipment. ? Safe sleeping arrangements for you and your baby.  Install a car seat in your vehicle. Have your car seat checked by a certified car seat installer to make sure that it is installed safely.  Think about who will help you with your new baby at home for at least the first several weeks after delivery.  What can I expect when I arrive at the birth center or hospital? Once you are in labor and have been admitted into the hospital or birth center, your health care provider may:  Review your pregnancy history and any concerns you have.  Insert an IV tube into one of your veins. This is used to give you fluids and medicines.  Check your blood pressure, pulse, temperature, and heart rate (vital signs).  Check whether your bag of water (amniotic sac) has broken (ruptured).  Talk with you about your birth plan and discuss pain control options.  Monitoring Your health care provider may monitor your contractions (uterine monitoring) and your baby's heart rate (fetal monitoring). You may need to be monitored:  Often, but not continuously (intermittently).  All the time or for long periods at a time (continuously). Continuous monitoring may be needed if: ? You are taking certain medicines, such as medicine to relieve pain or make your  contractions stronger. ? You have pregnancy or labor complications.  Monitoring may be done by:  Placing a special stethoscope or a handheld monitoring device on your abdomen to check your baby's heartbeat, and feeling your abdomen for contractions. This method of monitoring does not continuously record your baby's heartbeat or your contractions.  Placing monitors on your abdomen (external monitors) to record your baby's heartbeat and the frequency and length of contractions. You may not have to wear external monitors all the time.  Placing monitors inside of your uterus (internal monitors) to record your baby's heartbeat and the frequency, length, and strength of your contractions. ? Your health care provider may use internal monitors if he or she needs more information about the strength of your contractions or your baby's heart rate. ? Internal monitors are put in place by passing a thin, flexible wire through your vagina and into your uterus. Depending on the type of monitor, it may remain in your uterus or on your baby's head until birth. ? Your health care provider will discuss the benefits and risks of internal monitoring with you and will ask for your permission before inserting the monitors.  Telemetry. This is a type of continuous monitoring that can be done with external or internal monitors. Instead of having to stay in bed, you are able to move around during telemetry. Ask your health care provider if telemetry is an option for you.  Physical exam Your health care provider may perform a physical exam. This may include:  Checking whether your baby is positioned: ? With the head toward your vagina (head-down). This is most common. ? With the head toward the top of your uterus (head-up or breech). If your baby is in a breech position, your health care provider may try to turn your baby to a head-down position so you can deliver vaginally. If it does not seem that your baby can be born  vaginally, your provider may recommend surgery to deliver your baby. In rare cases, you may be able to deliver vaginally if your baby is head-up (breech delivery). ? Lying sideways (transverse). Babies that are lying sideways cannot be delivered vaginally.  Checking your cervix to determine: ? Whether it is thinning out (effacing). ? Whether it is opening up (dilating). ? How low your baby has moved into your birth canal.  What are the three stages of labor and delivery?  Normal labor and delivery is divided into the following three stages: Stage 1  Stage 1 is the longest stage of labor, and it  can last for hours or days. Stage 1 includes: ? Early labor. This is when contractions may be irregular, or regular and mild. Generally, early labor contractions are more than 10 minutes apart. ? Active labor. This is when contractions get longer, more regular, more frequent, and more intense. ? The transition phase. This is when contractions happen very close together, are very intense, and may last longer than during any other part of labor.  Contractions generally feel mild, infrequent, and irregular at first. They get stronger, more frequent (about every 2-3 minutes), and more regular as you progress from early labor through active labor and transition.  Many women progress through stage 1 naturally, but you may need help to continue making progress. If this happens, your health care provider may talk with you about: ? Rupturing your amniotic sac if it has not ruptured yet. ? Giving you medicine to help make your contractions stronger and more frequent.  Stage 1 ends when your cervix is completely dilated to 4 inches (10 cm) and completely effaced. This happens at the end of the transition phase. Stage 2  Once your cervix is completely effaced and dilated to 4 inches (10 cm), you may start to feel an urge to push. It is common for the body to naturally take a rest before feeling the urge to  push, especially if you received an epidural or certain other pain medicines. This rest period may last for up to 1-2 hours, depending on your unique labor experience.  During stage 2, contractions are generally less painful, because pushing helps relieve contraction pain. Instead of contraction pain, you may feel stretching and burning pain, especially when the widest part of your baby's head passes through the vaginal opening (crowning).  Your health care provider will closely monitor your pushing progress and your baby's progress through the vagina during stage 2.  Your health care provider may massage the area of skin between your vaginal opening and anus (perineum) or apply warm compresses to your perineum. This helps it stretch as the baby's head starts to crown, which can help prevent perineal tearing. ? In some cases, an incision may be made in your perineum (episiotomy) to allow the baby to pass through the vaginal opening. An episiotomy helps to make the opening of the vagina larger to allow more room for the baby to fit through.  It is very important to breathe and focus so your health care provider can control the delivery of your baby's head. Your health care provider may have you decrease the intensity of your pushing, to help prevent perineal tearing.  After delivery of your baby's head, the shoulders and the rest of the body generally deliver very quickly and without difficulty.  Once your baby is delivered, the umbilical cord may be cut right away, or this may be delayed for 1-2 minutes, depending on your baby's health. This may vary among health care providers, hospitals, and birth centers.  If you and your baby are healthy enough, your baby may be placed on your chest or abdomen to help maintain the baby's temperature and to help you bond with each other. Some mothers and babies start breastfeeding at this time. Your health care team will dry your baby and help keep your baby warm  during this time.  Your baby may need immediate care if he or she: ? Showed signs of distress during labor. ? Has a medical condition. ? Was born too early (prematurely). ? Had a bowel movement  before birth (meconium). ? Shows signs of difficulty transitioning from being inside the uterus to being outside of the uterus. If you are planning to breastfeed, your health care team will help you begin a feeding. Stage 3  The third stage of labor starts immediately after the birth of your baby and ends after you deliver the placenta. The placenta is an organ that develops during pregnancy to provide oxygen and nutrients to your baby in the womb.  Delivering the placenta may require some pushing, and you may have mild contractions. Breastfeeding can stimulate contractions to help you deliver the placenta.  After the placenta is delivered, your uterus should tighten (contract) and become firm. This helps to stop bleeding in your uterus. To help your uterus contract and to control bleeding, your health care provider may: ? Give you medicine by injection, through an IV tube, by mouth, or through your rectum (rectally). ? Massage your abdomen or perform a vaginal exam to remove any blood clots that are left in your uterus. ? Empty your bladder by placing a thin, flexible tube (catheter) into your bladder. ? Encourage you to breastfeed your baby. After labor is over, you and your baby will be monitored closely to ensure that you are both healthy until you are ready to go home. Your health care team will teach you how to care for yourself and your baby. This information is not intended to replace advice given to you by your health care provider. Make sure you discuss any questions you have with your health care provider. Document Released: 04/24/2008 Document Revised: 02/03/2016 Document Reviewed: 07/31/2015 Elsevier Interactive Patient Education  2018 [employer]. Cord Blood Banking Information Cord  blood banking is the process of collecting and storing the blood that is in the umbilical cord and placenta at the time of delivery. This blood contains stem cells, which can be used to treat many blood diseases, immune system disorders, and childhood cancers. Stem cells can also be used to research certain diseases and treatments. Many people who choose cord blood banking donate the blood. Donated blood can be used in lifesaving treatments or for research. Other people choose to store the blood privately. Blood that is stored privately can only be used with the person's permission. This option is often chosen if:  A family member needs a stem cell transplant.  The child is part of an ethnic minority.  The child was conceived through in vitro fertilization.  What should I look for in a blood bank? A blood bank is the organization that coordinates cord blood banking. Make sure the cord blood bank that you use:  Is accredited.  Is financially stable.  Handles a large volume of cord blood samples.  Has a procedure in place for transport and storage.  Allows you the option of transferring your cord blood sample.  Has a procedure in place if the bank goes out of business.  Clearly states all costs and limits to future costs.  People who choose to donate cord blood should not need to pay for blood banking. People who keep the blood for private use will need to pay for the first (initial) storage and pay a fee each year (annual fee). Other fees may also apply. What are the risks of cord blood banking? There are no health risks associated with cord blood banking. It is considered safe. How should I prepare? You must schedule this process at least 4-6 weeks before you will be giving birth. How is  the blood collected? The blood is collected as soon as the baby has been delivered. Within 15 minutes of delivery, a health care provider will take these actions to collect the blood:  Clamp the  umbilical cord at the top and bottom. This traps the blood in the umbilical cord.  Use a syringe or bag to collect the blood.  Insert needles into the placenta to collect (draw out) more blood.  What happens after the blood is collected? After the blood has been collected:  The blood will be sent to a blood bank.  The blood will be tested for genetic problems and infectious diseases. If the blood tests positive for a genetic problem or a disease, someone will contact you and let you know.  The blood will be frozen.  If your child develops a genetic condition, immune system disorder, or cancer, you will be responsible for contacting the blood bank and letting them know. This information is not intended to replace advice given to you by your health care provider. Make sure you discuss any questions you have with your health care provider. Document Released: 01/03/2010 Document Revised: 12/22/2015 Document Reviewed: 01/03/2015 Elsevier Interactive Patient Education  2018 [employer]. Breastfeeding Deciding to breastfeed is one of the best choices you can make for you and your baby. A change in hormones during pregnancy causes your breast tissue to grow and increases the number and size of your milk ducts. These hormones also allow proteins, sugars, and fats from your blood supply to make breast milk in your milk-producing glands. Hormones prevent breast milk from being released before your baby is born as well as prompt milk flow after birth. Once breastfeeding has begun, thoughts of your baby, as well as his or her sucking or crying, can stimulate the release of milk from your milk-producing glands. Benefits of breastfeeding For Your Baby  Your first milk (colostrum) helps your baby's digestive system function better.  There are antibodies in your milk that help your baby fight off infections.  Your baby has a lower incidence of asthma, allergies, and sudden infant death syndrome.  The  nutrients in breast milk are better for your baby than infant formulas and are designed uniquely for your baby's needs.  Breast milk improves your baby's brain development.  Your baby is less likely to develop other conditions, such as childhood obesity, asthma, or type 2 diabetes mellitus.  For You  Breastfeeding helps to create a very special bond between you and your baby.  Breastfeeding is convenient. Breast milk is always available at the correct temperature and costs nothing.  Breastfeeding helps to burn calories and helps you lose the weight gained during pregnancy.  Breastfeeding makes your uterus contract to its prepregnancy size faster and slows bleeding (lochia) after you give birth.  Breastfeeding helps to lower your risk of developing type 2 diabetes mellitus, osteoporosis, and breast or ovarian cancer later in life.  Signs that your baby is hungry Early Signs of Hunger  Increased alertness or activity.  Stretching.  Movement of the head from side to side.  Movement of the head and opening of the mouth when the corner of the mouth or cheek is stroked (rooting).  Increased sucking sounds, smacking lips, cooing, sighing, or squeaking.  Hand-to-mouth movements.  Increased sucking of fingers or hands.  Late Signs of Hunger  Fussing.  Intermittent crying.  Extreme Signs of Hunger Signs of extreme hunger will require calming and consoling before your baby will be able to  breastfeed successfully. Do not wait for the following signs of extreme hunger to occur before you initiate breastfeeding:  Restlessness.  A loud, strong cry.  Screaming.  Breastfeeding basics Breastfeeding Initiation  Find a comfortable place to sit or lie down, with your neck and back well supported.  Place a pillow or rolled up blanket under your baby to bring him or her to the level of your breast (if you are seated). Nursing pillows are specially designed to help support your arms  and your baby while you breastfeed.  Make sure that your baby's abdomen is facing your abdomen.  Gently massage your breast. With your fingertips, massage from your chest wall toward your nipple in a circular motion. This encourages milk flow. You may need to continue this action during the feeding if your milk flows slowly.  Support your breast with 4 fingers underneath and your thumb above your nipple. Make sure your fingers are well away from your nipple and your baby's mouth.  Stroke your baby's lips gently with your finger or nipple.  When your baby's mouth is open wide enough, quickly bring your baby to your breast, placing your entire nipple and as much of the colored area around your nipple (areola) as possible into your baby's mouth. ? More areola should be visible above your baby's upper lip than below the lower lip. ? Your baby's tongue should be between his or her lower gum and your breast.  Ensure that your baby's mouth is correctly positioned around your nipple (latched). Your baby's lips should create a seal on your breast and be turned out (everted).  It is common for your baby to suck about 2-3 minutes in order to start the flow of breast milk.  Latching Teaching your baby how to latch on to your breast properly is very important. An improper latch can cause nipple pain and decreased milk supply for you and poor weight gain in your baby. Also, if your baby is not latched onto your nipple properly, he or she may swallow some air during feeding. This can make your baby fussy. Burping your baby when you switch breasts during the feeding can help to get rid of the air. However, teaching your baby to latch on properly is still the best way to prevent fussiness from swallowing air while breastfeeding. Signs that your baby has successfully latched on to your nipple:  Silent tugging or silent sucking, without causing you pain.  Swallowing heard between every 3-4 sucks.  Muscle  movement above and in front of his or her ears while sucking.  Signs that your baby has not successfully latched on to nipple:  Sucking sounds or smacking sounds from your baby while breastfeeding.  Nipple pain.  If you think your baby has not latched on correctly, slip your finger into the corner of your baby's mouth to break the suction and place it between your baby's gums. Attempt breastfeeding initiation again. Signs of Successful Breastfeeding Signs from your baby:  A gradual decrease in the number of sucks or complete cessation of sucking.  Falling asleep.  Relaxation of his or her body.  Retention of a small amount of milk in his or her mouth.  Letting go of your breast by himself or herself.  Signs from you:  Breasts that have increased in firmness, weight, and size 1-3 hours after feeding.  Breasts that are softer immediately after breastfeeding.  Increased milk volume, as well as a change in milk consistency and color by  the fifth day of breastfeeding.  Nipples that are not sore, cracked, or bleeding.  Signs That Your Pecola Leisure is Getting Enough Milk  Wetting at least 1-2 diapers during the first 24 hours after birth.  Wetting at least 5-6 diapers every 24 hours for the first week after birth. The urine should be clear or pale yellow by 5 days after birth.  Wetting 6-8 diapers every 24 hours as your baby continues to grow and develop.  At least 3 stools in a 24-hour period by age 19 days. The stool should be soft and yellow.  At least 3 stools in a 24-hour period by age 74 days. The stool should be seedy and yellow.  No loss of weight greater than 10% of birth weight during the first 25 days of age.  Average weight gain of 4-7 ounces (113-198 g) per week after age 44 days.  Consistent daily weight gain by age 19 days, without weight loss after the age of 2 weeks.  After a feeding, your baby may spit up a small amount. This is common. Breastfeeding frequency and  duration Frequent feeding will help you make more milk and can prevent sore nipples and breast engorgement. Breastfeed when you feel the need to reduce the fullness of your breasts or when your baby shows signs of hunger. This is called "breastfeeding on demand." Avoid introducing a pacifier to your baby while you are working to establish breastfeeding (the first 4-6 weeks after your baby is born). After this time you may choose to use a pacifier. Research has shown that pacifier use during the first year of a baby's life decreases the risk of sudden infant death syndrome (SIDS). Allow your baby to feed on each breast as long as he or she wants. Breastfeed until your baby is finished feeding. When your baby unlatches or falls asleep while feeding from the first breast, offer the second breast. Because newborns are often sleepy in the first few weeks of life, you may need to awaken your baby to get him or her to feed. Breastfeeding times will vary from baby to baby. However, the following rules can serve as a guide to help you ensure that your baby is properly fed:  Newborns (babies 48 weeks of age or younger) may breastfeed every 1-3 hours.  Newborns should not go longer than 3 hours during the day or 5 hours during the night without breastfeeding.  You should breastfeed your baby a minimum of 8 times in a 24-hour period until you begin to introduce solid foods to your baby at around 50 months of age.  Breast milk pumping Pumping and storing breast milk allows you to ensure that your baby is exclusively fed your breast milk, even at times when you are unable to breastfeed. This is especially important if you are going back to work while you are still breastfeeding or when you are not able to be present during feedings. Your lactation consultant can give you guidelines on how long it is safe to store breast milk. A breast pump is a machine that allows you to pump milk from your breast into a sterile  bottle. The pumped breast milk can then be stored in a refrigerator or freezer. Some breast pumps are operated by hand, while others use electricity. Ask your lactation consultant which type will work best for you. Breast pumps can be purchased, but some hospitals and breastfeeding support groups lease breast pumps on a monthly basis. A lactation consultant can teach  you how to hand express breast milk, if you prefer not to use a pump. Caring for your breasts while you breastfeed Nipples can become dry, cracked, and sore while breastfeeding. The following recommendations can help keep your breasts moisturized and healthy:  Avoid using soap on your nipples.  Wear a supportive bra. Although not required, special nursing bras and tank tops are designed to allow access to your breasts for breastfeeding without taking off your entire bra or top. Avoid wearing underwire-style bras or extremely tight bras.  Air dry your nipples for 3-71minutes after each feeding.  Use only cotton bra pads to absorb leaked breast milk. Leaking of breast milk between feedings is normal.  Use lanolin on your nipples after breastfeeding. Lanolin helps to maintain your skin's normal moisture barrier. If you use pure lanolin, you do not need to wash it off before feeding your baby again. Pure lanolin is not toxic to your baby. You may also hand express a few drops of breast milk and gently massage that milk into your nipples and allow the milk to air dry.  In the first few weeks after giving birth, some women experience extremely full breasts (engorgement). Engorgement can make your breasts feel heavy, warm, and tender to the touch. Engorgement peaks within 3-5 days after you give birth. The following recommendations can help ease engorgement:  Completely empty your breasts while breastfeeding or pumping. You may want to start by applying warm, moist heat (in the shower or with warm water-soaked hand towels) just before feeding  or pumping. This increases circulation and helps the milk flow. If your baby does not completely empty your breasts while breastfeeding, pump any extra milk after he or she is finished.  Wear a snug bra (nursing or regular) or tank top for 1-2 days to signal your body to slightly decrease milk production.  Apply ice packs to your breasts, unless this is too uncomfortable for you.  Make sure that your baby is latched on and positioned properly while breastfeeding.  If engorgement persists after 48 hours of following these recommendations, contact your health care provider or a Ad[employer]. Overall health care recommendations while breastfeeding  Eat healthy foods. Alternate between meals and snacks, eating 3 of each per day. Because what you eat affects your breast milk, some of the foods may make your baby more irritable than usual. Avoid eating these foods if you are sure that they are negatively affecting your baby.  Drink milk, fruit juice, and water to satisfy your thirst (about 10 glasses a day).  Rest often, relax, and continue to take your prenatal vitamins to prevent fatigue, stress, and anemia.  Continue breast self-awareness checks.  Avoid chewing and smoking tobacco. Chemicals from cigarettes that pass into breast milk and exposure to secondhand smoke may harm your baby.  Avoid alcohol and drug use, including marijuana. Some medicines that may be harmful to your baby can pass through breast milk. It is important to ask your health care provider before taking any medicine, including all over-the-counter and prescription medicine as well as vitamin and herbal supplements. It is possible to become pregnant while breastfeeding. If birth control is desired, ask your health care provider about options that will be safe for your baby. Contact a health care provider if:  You feel like you want to stop breastfeeding or have become frustrated with breastfeeding.  You have  painful breasts or nipples.  Your nipples are cracked or bleeding.  Your breasts are red, tender,  or warm.  You have a swollen area on either breast.  You have a fever or chills.  You have nausea or vomiting.  You have drainage other than breast milk from your nipples.  Your breasts do not become full before feedings by the fifth day after you give birth.  You feel sad and depressed.  Your baby is too sleepy to eat well.  Your baby is having trouble sleeping.  Your baby is wetting less than 3 diapers in a 24-hour period.  Your baby has less than 3 stools in a 24-hour period.  Your baby's skin or the white part of his or her eyes becomes yellow.  Your baby is not gaining weight by 68 days of age. Get help right away if:  Your baby is overly tired (lethargic) and does not want to wake up and feed.  Your baby develops an unexplained fever. This information is not intended to replace advice given to you by your health care provider. Make sure you discuss any questions you have with your health care provider. Document Released: 07/16/2005 Document Revised: 12/28/2015 Document Reviewed: 01/07/2013 Elsevier Interactive Patient Education  2017 [employer]. Third Trimester of Pregnancy The third trimester is from week 28 through week 40 (months 7 through 9). The third trimester is a time when the unborn baby (fetus) is growing rapidly. At the end of the ninth month, the fetus is about 20 inches in length and weighs 6-10 pounds. Body changes during your third trimester Your body will continue to go through many changes during pregnancy. The changes vary from woman to woman. During the third trimester:  Your weight will continue to increase. You can expect to gain 25-35 pounds (11-16 kg) by the end of the pregnancy.  You may begin to get stretch marks on your hips, abdomen, and breasts.  You may urinate more often because the fetus is moving lower into your pelvis and pressing on  your bladder.  You may develop or continue to have heartburn. This is caused by increased hormones that slow down muscles in the digestive tract.  You may develop or continue to have constipation because increased hormones slow digestion and cause the muscles that push waste through your intestines to relax.  You may develop hemorrhoids. These are swollen veins (varicose veins) in the rectum that can itch or be painful.  You may develop swollen, bulging veins (varicose veins) in your legs.  You may have increased body aches in the pelvis, back, or thighs. This is due to weight gain and increased hormones that are relaxing your joints.  You may have changes in your hair. These can include thickening of your hair, rapid growth, and changes in texture. Some women also have hair loss during or after pregnancy, or hair that feels dry or thin. Your hair will most likely return to normal after your baby is born.  Your breasts will continue to grow and they will continue to become tender. A yellow fluid (colostrum) may leak from your breasts. This is the first milk you are producing for your baby.  Your belly button may stick out.  You may notice more swelling in your hands, face, or ankles.  You may have increased tingling or numbness in your hands, arms, and legs. The skin on your belly may also feel numb.  You may feel short of breath because of your expanding uterus.  You may have more problems sleeping. This can be caused by the size of your belly,  increased need to urinate, and an increase in your body's metabolism.  You may notice the fetus "dropping," or moving lower in your abdomen (lightening).  You may have increased vaginal discharge.  You may notice your joints feel loose and you may have pain around your pelvic bone.  What to expect at prenatal visits You will have prenatal exams every 2 weeks until week 36. Then you will have weekly prenatal exams. During a routine prenatal  visit:  You will be weighed to make sure you and the baby are growing normally.  Your blood pressure will be taken.  Your abdomen will be measured to track your baby's growth.  The fetal heartbeat will be listened to.  Any test results from the previous visit will be discussed.  You may have a cervical check near your due date to see if your cervix has softened or thinned (effaced).  You will be tested for Group B streptococcus. This happens between 35 and 37 weeks.  Your health care provider may ask you:  What your birth plan is.  How you are feeling.  If you are feeling the baby move.  If you have had any abnormal symptoms, such as leaking fluid, bleeding, severe headaches, or abdominal cramping.  If you are using any tobacco products, including cigarettes, chewing tobacco, and electronic cigarettes.  If you have any questions.  Other tests or screenings that may be performed during your third trimester include:  Blood tests that check for low iron levels (anemia).  Fetal testing to check the health, activity level, and growth of the fetus. Testing is done if you have certain medical conditions or if there are problems during the pregnancy.  Nonstress test (NST). This test checks the health of your baby to make sure there are no signs of problems, such as the baby not getting enough oxygen. During this test, a belt is placed around your belly. The baby is made to move, and its heart rate is monitored during movement.  What is false labor? False labor is a condition in which you feel small, irregular tightenings of the muscles in the womb (contractions) that usually go away with rest, changing position, or drinking water. These are called Braxton Hicks contractions. Contractions may last for hours, days, or even weeks before true labor sets in. If contractions come at regular intervals, become more frequent, increase in intensity, or become painful, you should see your health  care provider. What are the signs of labor?  Abdominal cramps.  Regular contractions that start at 10 minutes apart and become stronger and more frequent with time.  Contractions that start on the top of the uterus and spread down to the lower abdomen and back.  Increased pelvic pressure and dull back pain.  A watery or bloody mucus discharge that comes from the vagina.  Leaking of amniotic fluid. This is also known as your "water breaking." It could be a slow trickle or a gush. Let your health care provider know if it has a color or strange odor. If you have any of these signs, call your health care provider right away, even if it is before your due date. Follow these instructions at home: Medicines  Follow your health care provider's instructions regarding medicine use. Specific medicines may be either safe or unsafe to take during pregnancy.  Take a prenatal vitamin that contains at least 600 micrograms (mcg) of folic acid.  If you develop constipation, try taking a stool softener if your health  care provider approves. Eating and drinking  Eat a balanced diet that includes fresh fruits and vegetables, whole grains, good sources of protein such as meat, eggs, or tofu, and low-fat dairy. Your health care provider will help you determine the amount of weight gain that is right for you.  Avoid raw meat and uncooked cheese. These carry germs that can cause birth defects in the baby.  If you have low calcium intake from food, talk to your health care provider about whether you should take a daily calcium supplement.  Eat four or five small meals rather than three large meals a day.  Limit foods that are high in fat and processed sugars, such as fried and sweet foods.  To prevent constipation: ? Drink enough fluid to keep your urine clear or pale yellow. ? Eat foods that are high in fiber, such as fresh fruits and vegetables, whole grains, and beans. Activity  Exercise only as  directed by your health care provider. Most women can continue their usual exercise routine during pregnancy. Try to exercise for 30 minutes at least 5 days a week. Stop exercising if you experience uterine contractions.  Avoid heavy lifting.  Do not exercise in extreme heat or humidity, or at high altitudes.  Wear low-heel, comfortable shoes.  Practice good posture.  You may continue to have sex unless your health care provider tells you otherwise. Relieving pain and discomfort  Take frequent breaks and rest with your legs elevated if you have leg cramps or low back pain.  Take warm sitz baths to soothe any pain or discomfort caused by hemorrhoids. Use hemorrhoid cream if your health care provider approves.  Wear a good support bra to prevent discomfort from breast tenderness.  If you develop varicose veins: ? Wear support pantyhose or compression stockings as told by your healthcare provider. ? Elevate your feet for 15 minutes, 3-4 times a day. Prenatal care  Write down your questions. Take them to your prenatal visits.  Keep all your prenatal visits as told by your health care provider. This is important. Safety  Wear your seat belt at all times when driving.  Make a list of emergency phone numbers, including numbers for family, friends, the hospital, and police and fire departments. General instructions  Avoid cat litter boxes and soil used by cats. These carry germs that can cause birth defects in the baby. If you have a cat, ask someone to clean the litter box for you.  Do not travel far distances unless it is absolutely necessary and only with the approval of your health care provider.  Do not use hot tubs, steam rooms, or saunas.  Do not drink alcohol.  Do not use any products that contain nicotine or tobacco, such as cigarettes and e-cigarettes. If you need help quitting, ask your health care provider.  Do not use any medicinal herbs or unprescribed drugs. These  chemicals affect the formation and growth of the baby.  Do not douche or use tampons or scented sanitary pads.  Do not cross your legs for long periods of time.  To prepare for the arrival of your baby: ? Take prenatal classes to understand, practice, and ask questions about labor and delivery. ? Make a trial run to the hospital. ? Visit the hospital and tour the maternity area. ? Arrange for maternity or paternity leave through employers. ? Arrange for family and friends to take care of pets while you are in the hospital. ? Purchase a rear-facing car  seat and make sure you know how to install it in your car. ? Pack your hospital bag. ? Prepare the baby's nursery. Make sure to remove all pillows and stuffed animals from the baby's crib to prevent suffocation.  Visit your dentist if you have not gone during your pregnancy. Use a soft toothbrush to brush your teeth and be gentle when you floss. Contact a health care provider if:  You are unsure if you are in labor or if your water has broken.  You become dizzy.  You have mild pelvic cramps, pelvic pressure, or nagging pain in your abdominal area.  You have lower back pain.  You have persistent nausea, vomiting, or diarrhea.  You have an unusual or bad smelling vaginal discharge.  You have pain when you urinate. Get help right away if:  Your water breaks before 37 weeks.  You have regular contractions less than 5 minutes apart before 37 weeks.  You have a fever.  You are leaking fluid from your vagina.  You have spotting or bleeding from your vagina.  You have severe abdominal pain or cramping.  You have rapid weight loss or weight gain.  You have shortness of breath with chest pain.  You notice sudden or extreme swelling of your face, hands, ankles, feet, or legs.  Your baby makes fewer than 10 movements in 2 hours.  You have severe headaches that do not go away when you take medicine.  You have vision  changes. Summary  The third trimester is from week 28 through week 40, months 7 through 9. The third trimester is a time when the unborn baby (fetus) is growing rapidly.  During the third trimester, your discomfort may increase as you and your baby continue to gain weight. You may have abdominal, leg, and back pain, sleeping problems, and an increased need to urinate.  During the third trimester your breasts will keep growing and they will continue to become tender. A yellow fluid (colostrum) may leak from your breasts. This is the first milk you are producing for your baby.  False labor is a condition in which you feel small, irregular tightenings of the muscles in the womb (contractions) that eventually go away. These are called Braxton Hicks contractions. Contractions may last for hours, days, or even weeks before true labor sets in.  Signs of labor can include: abdominal cramps; regular contractions that start at 10 minutes apart and become stronger and more frequent with time; watery or bloody mucus discharge that comes from the vagina; increased pelvic pressure and dull back pain; and leaking of amniotic fluid. This information is not intended to replace advice given to you by your health care provider. Make sure you discuss any questions you have with your health care provider. Document Released: 07/10/2001 Document Revised: 12/22/2015 Document Reviewed: 09/16/2012 Elsevier Interactive Patient Education  2017 [employer].

## 2017-04-16 NOTE — Progress Notes (Signed)
ROB-Reports increased reflux in the last two (2) nights. Discussed home treatment measures. Rx: Zantac, see orders. Desires NCB, plans breastfeeding, and declines PP contraception. CBC and glucola today. TDaP given. BTC reviewed and signed. Reviewed red flag symptoms and when to call. RTC x 2 weeks for ROB with Pattricia Boss or sooner if needed.

## 2017-04-17 LAB — CBC WITH DIFFERENTIAL/PLATELET
BASOS ABS: 0 10*3/uL (ref 0.0–0.2)
Basos: 0 %
EOS (ABSOLUTE): 0.1 10*3/uL (ref 0.0–0.4)
Eos: 2 %
HEMOGLOBIN: 10.8 g/dL — AB (ref 11.1–15.9)
Hematocrit: 32.2 % — ABNORMAL LOW (ref 34.0–46.6)
Immature Grans (Abs): 0.1 10*3/uL (ref 0.0–0.1)
Immature Granulocytes: 1 %
LYMPHS: 16 %
Lymphocytes Absolute: 1.3 10*3/uL (ref 0.7–3.1)
MCH: 29 pg (ref 26.6–33.0)
MCHC: 33.5 g/dL (ref 31.5–35.7)
MCV: 87 fL (ref 79–97)
MONOCYTES: 4 %
Monocytes Absolute: 0.3 10*3/uL (ref 0.1–0.9)
NEUTROS ABS: 6.3 10*3/uL (ref 1.4–7.0)
Neutrophils: 77 %
PLATELETS: 215 10*3/uL (ref 150–379)
RBC: 3.72 x10E6/uL — ABNORMAL LOW (ref 3.77–5.28)
RDW: 13.2 % (ref 12.3–15.4)
WBC: 8 10*3/uL (ref 3.4–10.8)

## 2017-04-17 LAB — GLUCOSE, 1 HOUR GESTATIONAL: Gestational Diabetes Screen: 111 mg/dL (ref 65–139)

## 2017-04-22 ENCOUNTER — Encounter: Payer: Self-pay | Admitting: Certified Nurse Midwife

## 2017-04-30 ENCOUNTER — Ambulatory Visit (INDEPENDENT_AMBULATORY_CARE_PROVIDER_SITE_OTHER): Admitting: Certified Nurse Midwife

## 2017-04-30 VITALS — BP 117/66 | HR 75 | Wt 162.8 lb

## 2017-04-30 DIAGNOSIS — Z3483 Encounter for supervision of other normal pregnancy, third trimester: Secondary | ICD-10-CM

## 2017-04-30 LAB — POCT URINALYSIS DIPSTICK
Bilirubin, UA: NEGATIVE
Glucose, UA: NEGATIVE
Ketones, UA: NEGATIVE
Leukocytes, UA: NEGATIVE
NITRITE UA: NEGATIVE
PH UA: 6 (ref 5.0–8.0)
PROTEIN UA: NEGATIVE
RBC UA: NEGATIVE
Spec Grav, UA: 1.01 (ref 1.010–1.025)
Urobilinogen, UA: 0.2 E.U./dL

## 2017-04-30 NOTE — Progress Notes (Signed)
ROB, doing well. No complaints. Endorses good fetal movement. Follow up 2 wks.   Doreene Burke, CNM

## 2017-04-30 NOTE — Patient Instructions (Signed)

## 2017-05-14 ENCOUNTER — Ambulatory Visit (INDEPENDENT_AMBULATORY_CARE_PROVIDER_SITE_OTHER): Admitting: Certified Nurse Midwife

## 2017-05-14 VITALS — BP 114/62 | HR 89 | Wt 164.8 lb

## 2017-05-14 DIAGNOSIS — Z3493 Encounter for supervision of normal pregnancy, unspecified, third trimester: Secondary | ICD-10-CM

## 2017-05-14 LAB — POCT URINALYSIS DIPSTICK
Bilirubin, UA: NEGATIVE
Blood, UA: NEGATIVE
GLUCOSE UA: NEGATIVE
KETONES UA: NEGATIVE
Leukocytes, UA: NEGATIVE
NITRITE UA: NEGATIVE
Protein, UA: NEGATIVE
SPEC GRAV UA: 1.01 (ref 1.010–1.025)
UROBILINOGEN UA: 0.2 U/dL
pH, UA: 6.5 (ref 5.0–8.0)

## 2017-05-14 NOTE — Progress Notes (Signed)
ROB- pt is doing well 

## 2017-05-14 NOTE — Progress Notes (Signed)
ROB-Pt doing well, no questions or concerns. Reviewed red flag symptoms and when to call. RTC x 2 weeks for ROB or sooner if needed.  

## 2017-05-27 ENCOUNTER — Encounter: Payer: Self-pay | Admitting: Certified Nurse Midwife

## 2017-05-27 ENCOUNTER — Ambulatory Visit (INDEPENDENT_AMBULATORY_CARE_PROVIDER_SITE_OTHER): Admitting: Certified Nurse Midwife

## 2017-05-27 VITALS — BP 112/73 | HR 97 | Wt 168.9 lb

## 2017-05-27 DIAGNOSIS — Z3493 Encounter for supervision of normal pregnancy, unspecified, third trimester: Secondary | ICD-10-CM

## 2017-05-27 DIAGNOSIS — O26813 Pregnancy related exhaustion and fatigue, third trimester: Secondary | ICD-10-CM

## 2017-05-27 DIAGNOSIS — O26893 Other specified pregnancy related conditions, third trimester: Secondary | ICD-10-CM

## 2017-05-27 DIAGNOSIS — R51 Headache: Secondary | ICD-10-CM

## 2017-05-27 LAB — POCT URINALYSIS DIPSTICK
Bilirubin, UA: NEGATIVE
Blood, UA: NEGATIVE
GLUCOSE UA: NEGATIVE
Ketones, UA: NEGATIVE
Nitrite, UA: NEGATIVE
Protein, UA: NEGATIVE
SPEC GRAV UA: 1.01 (ref 1.010–1.025)
UROBILINOGEN UA: 0.2 U/dL
pH, UA: 6 (ref 5.0–8.0)

## 2017-05-27 MED ORDER — MAGNESIUM OXIDE 400 MG PO CAPS: 1.0000 | ORAL_CAPSULE | Freq: Two times a day (BID) | ORAL | 1 refills | Status: DC

## 2017-05-27 NOTE — Progress Notes (Signed)
OB WORK IN- pt c/o headache since Saturday, denies seeing any spots, feeling very fatigued

## 2017-05-27 NOTE — Patient Instructions (Signed)
Preeclampsia and Eclampsia Preeclampsia is a serious condition that develops only during pregnancy. It is also called toxemia of pregnancy. This condition causes high blood pressure along with other symptoms, such as swelling and headaches. These symptoms may develop as the condition gets worse. Preeclampsia may occur at 20 weeks of pregnancy or later. Diagnosing and treating preeclampsia early is very important. If not treated early, it can cause serious problems for you and your baby. One problem it can lead to is eclampsia, which is a condition that causes muscle jerking or shaking (convulsions or seizures) in the mother. Delivering your baby is the best treatment for preeclampsia or eclampsia. Preeclampsia and eclampsia symptoms usually go away after your baby is born. What are the causes? The cause of preeclampsia is not known. What increases the risk? The following risk factors make you more likely to develop preeclampsia:  Being pregnant for the first time.  Having had preeclampsia during a past pregnancy.  Having a family history of preeclampsia.  Having high blood pressure.  Being pregnant with twins or triplets.  Being 46 or older.  Being African-American.  Having kidney disease or diabetes.  Having medical conditions such as lupus or blood diseases.  Being very overweight (obese).  What are the signs or symptoms? The earliest signs of preeclampsia are:  High blood pressure.  Increased protein in your urine. Your health care provider will check for this at every visit before you give birth (prenatal visit).  Other symptoms that may develop as the condition gets worse include:  Severe headaches.  Sudden weight gain.  Swelling of the hands, face, legs, and feet.  Nausea and vomiting.  Vision problems, such as blurred or double vision.  Numbness in the face, arms, legs, and feet.  Urinating less than usual.  Dizziness.  Slurred speech.  Abdominal pain,  especially upper abdominal pain.  Convulsions or seizures.  Symptoms generally go away after giving birth. How is this diagnosed? There are no screening tests for preeclampsia. Your health care provider will ask you about symptoms and check for signs of preeclampsia during your prenatal visits. You may also have tests that include:  Urine tests.  Blood tests.  Checking your blood pressure.  Monitoring your baby's heart rate.  Ultrasound.  How is this treated? You and your health care provider will determine the treatment approach that is best for you. Treatment may include:  Having more frequent prenatal exams to check for signs of preeclampsia, if you have an increased risk for preeclampsia.  Bed rest.  Reducing how much salt (sodium) you eat.  Medicine to lower your blood pressure.  Staying in the hospital, if your condition is severe. There, treatment will focus on controlling your blood pressure and the amount of fluids in your body (fluid retention).  You may need to take medicine (magnesium sulfate) to prevent seizures. This medicine may be given as an injection or through an IV tube.  Delivering your baby early, if your condition gets worse. You may have your labor started with medicine (induced), or you may have a cesarean delivery.  Follow these instructions at home: Eating and drinking   Drink enough fluid to keep your urine clear or pale yellow.  Eat a healthy diet that is low in sodium. Do not add salt to your food. Check nutrition labels to see how much sodium a food or beverage contains.  Avoid caffeine. Lifestyle  Do not use any products that contain nicotine or tobacco, such as cigarettes  and e-cigarettes. If you need help quitting, ask your health care provider.  Do not use alcohol or drugs.  Avoid stress as much as possible. Rest and get plenty of sleep. General instructions  Take over-the-counter and prescription medicines only as told by your  health care provider.  When lying down, lie on your side. This keeps pressure off of your baby.  When sitting or lying down, raise (elevate) your feet. Try putting some pillows underneath your lower legs.  Exercise regularly. Ask your health care provider what kinds of exercise are best for you.  Keep all follow-up and prenatal visits as told by your health care provider. This is important. How is this prevented? To prevent preeclampsia or eclampsia from developing during another pregnancy:  Get proper medical care during pregnancy. Your health care provider may be able to prevent preeclampsia or diagnose and treat it early.  Your health care provider may have you take a low-dose aspirin or a calcium supplement during your next pregnancy.  You may have tests of your blood pressure and kidney function after giving birth.  Maintain a healthy weight. Ask your health care provider for help managing weight gain during pregnancy.  Work with your health care provider to manage any long-term (chronic) health conditions you have, such as diabetes or kidney problems.  Contact a health care provider if:  You gain more weight than expected.  You have headaches.  You have nausea or vomiting.  You have abdominal pain.  You feel dizzy or light-headed. Get help right away if:  You develop sudden or severe swelling anywhere in your body. This usually happens in the legs.  You gain 5 lbs (2.3 kg) or more during one week.  You have severe: ? Abdominal pain. ? Headaches. ? Dizziness. ? Vision problems. ? Confusion. ? Nausea or vomiting.  You have a seizure.  You have trouble moving any part of your body.  You develop numbness in any part of your body.  You have trouble speaking.  You have any abnormal bleeding.  You pass out. This information is not intended to replace advice given to you by your health care provider. Make sure you discuss any questions you have with your health  care provider. Document Released: 07/13/2000 Document Revised: 03/13/2016 Document Reviewed: 02/20/2016 Elsevier Interactive Patient Education  2018 [employer] Pregnancy and Anemia Anemia is a condition in which the concentration of red blood cells or hemoglobin in the blood is below normal. Hemoglobin is a substance in red blood cells that carries oxygen to the tissues of the body. Anemia results in not enough oxygen reaching these tissues. Anemia during pregnancy is common because the fetus uses more iron and folic acid as it is developing. Your body may not produce enough red blood cells because of this. Also, during pregnancy, the liquid part of the blood (plasma) increases by about 50%, and the red blood cells increase by only 25%. This lowers the concentration of the red blood cells and creates a natural anemia-like situation. What are the causes? The most common cause of anemia during pregnancy is not having enough iron in the body to make red blood cells (iron deficiency anemia). Other causes may include:  Folic acid deficiency.  Vitamin B12 deficiency.  Certain prescription or over-the-counter medicines.  Certain medical conditions or infections that destroy red blood cells.  A low platelet count and bleeding caused by antibodies that go through the placenta to the fetus from the mother's blood.  What  are the signs or symptoms? Mild anemia may not be noticeable. If it becomes severe, symptoms may include:  Tiredness.  Shortness of breath, especially with exercise.  Weakness.  Fainting.  Pale looking skin.  Headaches.  Feeling a fast or irregular heartbeat (palpitations).  How is this diagnosed? The type of anemia is usually diagnosed from your family and medical history and blood tests. How is this treated? Treatment of anemia during pregnancy depends on the cause of the anemia. Treatment can include:  Supplements of iron, vitamin B12, or folic acid.  A blood  transfusion. This may be needed if blood loss is severe.  Hospitalization. This may be needed if there is significant continual blood loss.  Dietary changes.  Follow these instructions at home:  Follow your dietitian's or health care provider's dietary recommendations.  Increase your vitamin C intake. This will help the stomach absorb more iron.  Eat a diet rich in iron. This would include foods such as: ? Liver. ? Beef. ? Whole grain bread. ? Eggs. ? Dried fruit.  Take iron and vitamins as directed by your health care provider.  Eat green leafy vegetables. These are a good source of folic acid. Contact a health care provider if:  You have frequent or lasting headaches.  You are looking pale.  You are bruising easily. Get help right away if:  You have extreme weakness, shortness of breath, or chest pain.  You become dizzy or have trouble concentrating.  You have heavy vaginal bleeding.  You develop a rash.  You have bloody or black, tarry stools.  You faint.  You vomit up blood.  You vomit repeatedly.  You have abdominal pain.  You have a fever or persistent symptoms for more than 2-3 days.  You have a fever and your symptoms suddenly get worse.  You are dehydrated. This information is not intended to replace advice given to you by your health care provider. Make sure you discuss any questions you have with your health care provider. Document Released: 07/13/2000 Document Revised: 12/22/2015 Document Reviewed: 02/25/2013 Elsevier Interactive Patient Education  2017 [employer].

## 2017-05-28 ENCOUNTER — Encounter: Admitting: Certified Nurse Midwife

## 2017-05-28 LAB — COMPREHENSIVE METABOLIC PANEL
A/G RATIO: 1.3 (ref 1.2–2.2)
ALT: 7 IU/L (ref 0–32)
AST: 16 IU/L (ref 0–40)
Albumin: 3.6 g/dL (ref 3.5–5.5)
Alkaline Phosphatase: 123 IU/L — ABNORMAL HIGH (ref 39–117)
BUN/Creatinine Ratio: 9 (ref 9–23)
BUN: 5 mg/dL — AB (ref 6–20)
Bilirubin Total: <0.2 mg/dL (ref 0.0–1.2)
CALCIUM: 8.7 mg/dL (ref 8.7–10.2)
CO2: 18 mmol/L — AB (ref 20–29)
Chloride: 103 mmol/L (ref 96–106)
Creatinine, Ser: 0.56 mg/dL — ABNORMAL LOW (ref 0.57–1.00)
GFR calc Af Amer: 144 mL/min/{1.73_m2} (ref >59)
GFR, EST NON AFRICAN AMERICAN: 125 mL/min/{1.73_m2} (ref >59)
GLUCOSE: 84 mg/dL (ref 65–99)
Globulin, Total: 2.7 g/dL (ref 1.5–4.5)
POTASSIUM: 4.2 mmol/L (ref 3.5–5.2)
Sodium: 138 mmol/L (ref 134–144)
Total Protein: 6.3 g/dL (ref 6.0–8.5)

## 2017-05-28 LAB — CBC
Hematocrit: 34.8 % (ref 34.0–46.6)
Hemoglobin: 11.4 g/dL (ref 11.1–15.9)
MCH: 28.7 pg (ref 26.6–33.0)
MCHC: 32.8 g/dL (ref 31.5–35.7)
MCV: 88 fL (ref 79–97)
PLATELETS: 232 10*3/uL (ref 150–379)
RBC: 3.97 x10E6/uL (ref 3.77–5.28)
RDW: 13.4 % (ref 12.3–15.4)
WBC: 8.6 10*3/uL (ref 3.4–10.8)

## 2017-06-02 NOTE — Progress Notes (Signed)
ROB-Reports persistent dull headache, fatigue, and lower extremity swelling. Denies difficulty breathing or respiratory distress, congestion or runny nose, sinus pressure, and blurred vision or spots before eyes. Mild relief with Tylenol. Negative homan sign. Will obtain CBC and CMP today. Discussed home treatment measures. Rx: Magnesium oxide, see orders. Reviewed red flag symptoms and when to call. RTC x 2 weeks for 36 week cultures and ROB or sooner if needed.

## 2017-06-12 ENCOUNTER — Encounter: Payer: Self-pay | Admitting: Certified Nurse Midwife

## 2017-06-12 ENCOUNTER — Other Ambulatory Visit: Payer: Self-pay

## 2017-06-12 ENCOUNTER — Ambulatory Visit (INDEPENDENT_AMBULATORY_CARE_PROVIDER_SITE_OTHER): Admitting: Certified Nurse Midwife

## 2017-06-12 VITALS — BP 115/87 | HR 81 | Wt 170.6 lb

## 2017-06-12 DIAGNOSIS — Z3493 Encounter for supervision of normal pregnancy, unspecified, third trimester: Secondary | ICD-10-CM

## 2017-06-12 DIAGNOSIS — Z113 Encounter for screening for infections with a predominantly sexual mode of transmission: Secondary | ICD-10-CM

## 2017-06-12 DIAGNOSIS — Z131 Encounter for screening for diabetes mellitus: Secondary | ICD-10-CM

## 2017-06-12 LAB — POCT URINALYSIS DIPSTICK
Bilirubin, UA: NEGATIVE
Blood, UA: NEGATIVE
Glucose, UA: NEGATIVE
Ketones, UA: NEGATIVE
Leukocytes, UA: NEGATIVE
NITRITE UA: NEGATIVE
PROTEIN UA: NEGATIVE
SPEC GRAV UA: 1.015 (ref 1.010–1.025)
UROBILINOGEN UA: 0.2 U/dL
pH, UA: 6 (ref 5.0–8.0)

## 2017-06-12 NOTE — Progress Notes (Signed)
ROB, doing well. No complaints. Discussed labor precautions. GBS today, will follow up with results. ROB in 1 wks .   Doreene BurkeAnnie Branda Chaudhary, CNM

## 2017-06-12 NOTE — Patient Instructions (Signed)
Group B Streptococcus Infection During Pregnancy Group B Streptococcus (GBS) is a type of bacteria (Streptococcus agalactiae) that is often found in healthy people, commonly in the rectum, vagina, and intestines. In people who are healthy and not pregnant, the bacteria rarely cause serious illness or complications. However, women who test positive for GBS during pregnancy can pass the bacteria to their baby during childbirth, which can cause serious infection in the baby after birth. Women with GBS may also have infections during their pregnancy or immediately after childbirth, such as such as urinary tract infections (UTIs) or infections of the uterus (uterine infections). Having GBS also increases a woman's risk of complications during pregnancy, such as early (preterm) labor or delivery, miscarriage, or stillbirth. Routine testing (screening) for GBS is recommended for all pregnant women. What increases the risk? You may have a higher risk for GBS infection during pregnancy if you had one during a past pregnancy. What are the signs or symptoms? In most cases, GBS infection does not cause symptoms in pregnant women. Signs and symptoms of a possible GBS-related infection may include:  Labor starting before the 37th week of pregnancy.  A UTI or bladder infection, which may cause: ? Fever. ? Pain or burning during urination. ? Frequent urination.  Fever during labor, along with: ? Bad-smelling discharge. ? Uterine tenderness. ? Rapid heartbeat in the mother, baby, or both.  Rare but serious symptoms of a possible GBS-related infection in women include:  Blood infection (septicemia). This may cause fever, chills, or confusion.  Lung infection (pneumonia). This may cause fever, chills, cough, rapid breathing, difficulty breathing, or chest pain.  Bone, joint, skin, or soft tissue infection.  How is this diagnosed? You may be screened for GBS between week 35 and week 37 of your pregnancy. If  you have symptoms of preterm labor, you may be screened earlier. This condition is diagnosed based on lab test results from:  A swab of fluid from the vagina and rectum.  A urine sample.  How is this treated? This condition is treated with antibiotic medicine. When you go into labor, or as soon as your water breaks (your membranes rupture), you will be given antibiotics through an IV tube. Antibiotics will continue until after you give birth. If you are having a cesarean delivery, you do not need antibiotics unless your membranes have already ruptured. Follow these instructions at home:  Take over-the-counter and prescription medicines only as told by your health care provider.  Take your antibiotic medicine as told by your health care provider. Do not stop taking the antibiotic even if you start to feel better.  Keep all pre-birth (prenatal) visits and follow-up visits as told by your health care provider. This is important. Contact a health care provider if:  You have pain or burning when you urinate.  You have to urinate frequently.  You have a fever or chills.  You develop a bad-smelling vaginal discharge. Get help right away if:  Your membranes rupture.  You go into labor.  You have severe pain in your abdomen.  You have difficulty breathing.  You have chest pain. This information is not intended to replace advice given to you by your health care provider. Make sure you discuss any questions you have with your health care provider. Document Released: 10/23/2007 Document Revised: 02/10/2016 Document Reviewed: 02/09/2016 Elsevier Interactive Patient Education  2018 [employer] Inc[employer]  

## 2017-06-14 ENCOUNTER — Encounter: Payer: Self-pay | Admitting: Certified Nurse Midwife

## 2017-06-14 LAB — STREP GP B NAA: Strep Gp B NAA: POSITIVE — AB

## 2017-06-14 LAB — GC/CHLAMYDIA PROBE AMP
Chlamydia trachomatis, NAA: NEGATIVE
NEISSERIA GONORRHOEAE BY PCR: NEGATIVE

## 2017-06-18 ENCOUNTER — Ambulatory Visit (INDEPENDENT_AMBULATORY_CARE_PROVIDER_SITE_OTHER): Admitting: Obstetrics and Gynecology

## 2017-06-18 VITALS — BP 117/79 | HR 87 | Wt 174.0 lb

## 2017-06-18 DIAGNOSIS — Z3493 Encounter for supervision of normal pregnancy, unspecified, third trimester: Secondary | ICD-10-CM

## 2017-06-18 LAB — POCT URINALYSIS DIPSTICK
BILIRUBIN UA: NEGATIVE
Glucose, UA: NEGATIVE
KETONES UA: NEGATIVE
Nitrite, UA: NEGATIVE
PH UA: 6 (ref 5.0–8.0)
PROTEIN UA: NEGATIVE
RBC UA: NEGATIVE
SPEC GRAV UA: 1.01 (ref 1.010–1.025)
Urobilinogen, UA: 0.2 E.U./dL

## 2017-06-18 NOTE — Progress Notes (Signed)
ROB-reviewed GBS+ results; labor precautions discussed.

## 2017-06-18 NOTE — Progress Notes (Signed)
ROB- pt is having pelvic pressure, some contractions 

## 2017-06-24 ENCOUNTER — Encounter: Payer: Self-pay | Admitting: Certified Nurse Midwife

## 2017-06-24 ENCOUNTER — Ambulatory Visit (INDEPENDENT_AMBULATORY_CARE_PROVIDER_SITE_OTHER): Admitting: Certified Nurse Midwife

## 2017-06-24 VITALS — BP 115/69 | HR 86 | Wt 176.1 lb

## 2017-06-24 DIAGNOSIS — Z3493 Encounter for supervision of normal pregnancy, unspecified, third trimester: Secondary | ICD-10-CM

## 2017-06-24 DIAGNOSIS — B951 Streptococcus, group B, as the cause of diseases classified elsewhere: Secondary | ICD-10-CM

## 2017-06-24 LAB — POCT URINALYSIS DIPSTICK
BILIRUBIN UA: NEGATIVE
Glucose, UA: NEGATIVE
Ketones, UA: NEGATIVE
Leukocytes, UA: NEGATIVE
NITRITE UA: NEGATIVE
PH UA: 7.5 (ref 5.0–8.0)
PROTEIN UA: NEGATIVE
RBC UA: NEGATIVE
UROBILINOGEN UA: 0.2 U/dL

## 2017-06-24 NOTE — Progress Notes (Signed)
Pt is here for an ROB. 

## 2017-06-24 NOTE — Progress Notes (Signed)
ROB-Doing well, no questions or concerns. SVE: unchanged since last exam. Discussed treatment of GBS and intrapartum care options. Reviewed red flag symptoms and when to call. RTC x 1 week for ROB or sooner if needed.

## 2017-06-24 NOTE — Patient Instructions (Signed)
Vaginal Delivery Vaginal delivery means that you will give birth by pushing your baby out of your birth canal (vagina). A team of health care providers will help you before, during, and after vaginal delivery. Birth experiences are unique for every woman and every pregnancy, and birth experiences vary depending on where you choose to give birth. What should I do to prepare for my baby's birth? Before your baby is born, it is important to talk with your health care provider about:  Your labor and delivery preferences. These may include: ? Medicines that you may be given. ? How you will manage your pain. This might include non-medical pain relief techniques or injectable pain relief such as epidural analgesia. ? How you and your baby will be monitored during labor and delivery. ? Who may be in the labor and delivery room with you. ? Your feelings about surgical delivery of your baby (cesarean delivery, or C-section) if this becomes necessary. ? Your feelings about receiving donated blood through an IV tube (blood transfusion) if this becomes necessary.  Whether you are able: ? To take pictures or videos of the birth. ? To eat during labor and delivery. ? To move around, walk, or change positions during labor and delivery.  What to expect after your baby is born, such as: ? Whether delayed umbilical cord clamping and cutting is offered. ? Who will care for your baby right after birth. ? Medicines or tests that may be recommended for your baby. ? Whether breastfeeding is supported in your hospital or birth center. ? How long you will be in the hospital or birth center.  How any medical conditions you have may affect your baby or your labor and delivery experience.  To prepare for your baby's birth, you should also:  Attend all of your health care visits before delivery (prenatal visits) as recommended by your health care provider. This is important.  Prepare your home for your baby's  arrival. Make sure that you have: ? Diapers. ? Baby clothing. ? Feeding equipment. ? Safe sleeping arrangements for you and your baby.  Install a car seat in your vehicle. Have your car seat checked by a certified car seat installer to make sure that it is installed safely.  Think about who will help you with your new baby at home for at least the first several weeks after delivery.  What can I expect when I arrive at the birth center or hospital? Once you are in labor and have been admitted into the hospital or birth center, your health care provider may:  Review your pregnancy history and any concerns you have.  Insert an IV tube into one of your veins. This is used to give you fluids and medicines.  Check your blood pressure, pulse, temperature, and heart rate (vital signs).  Check whether your bag of water (amniotic sac) has broken (ruptured).  Talk with you about your birth plan and discuss pain control options.  Monitoring Your health care provider may monitor your contractions (uterine monitoring) and your baby's heart rate (fetal monitoring). You may need to be monitored:  Often, but not continuously (intermittently).  All the time or for long periods at a time (continuously). Continuous monitoring may be needed if: ? You are taking certain medicines, such as medicine to relieve pain or make your contractions stronger. ? You have pregnancy or labor complications.  Monitoring may be done by:  Placing a special stethoscope or a handheld monitoring device on your abdomen to   check your baby's heartbeat, and feeling your abdomen for contractions. This method of monitoring does not continuously record your baby's heartbeat or your contractions.  Placing monitors on your abdomen (external monitors) to record your baby's heartbeat and the frequency and length of contractions. You may not have to wear external monitors all the time.  Placing monitors inside of your uterus  (internal monitors) to record your baby's heartbeat and the frequency, length, and strength of your contractions. ? Your health care provider may use internal monitors if he or she needs more information about the strength of your contractions or your baby's heart rate. ? Internal monitors are put in place by passing a thin, flexible wire through your vagina and into your uterus. Depending on the type of monitor, it may remain in your uterus or on your baby's head until birth. ? Your health care provider will discuss the benefits and risks of internal monitoring with you and will ask for your permission before inserting the monitors.  Telemetry. This is a type of continuous monitoring that can be done with external or internal monitors. Instead of having to stay in bed, you are able to move around during telemetry. Ask your health care provider if telemetry is an option for you.  Physical exam Your health care provider may perform a physical exam. This may include:  Checking whether your baby is positioned: ? With the head toward your vagina (head-down). This is most common. ? With the head toward the top of your uterus (head-up or breech). If your baby is in a breech position, your health care provider may try to turn your baby to a head-down position so you can deliver vaginally. If it does not seem that your baby can be born vaginally, your provider may recommend surgery to deliver your baby. In rare cases, you may be able to deliver vaginally if your baby is head-up (breech delivery). ? Lying sideways (transverse). Babies that are lying sideways cannot be delivered vaginally.  Checking your cervix to determine: ? Whether it is thinning out (effacing). ? Whether it is opening up (dilating). ? How low your baby has moved into your birth canal.  What are the three stages of labor and delivery?  Normal labor and delivery is divided into the following three stages: Stage 1  Stage 1 is the  longest stage of labor, and it can last for hours or days. Stage 1 includes: ? Early labor. This is when contractions may be irregular, or regular and mild. Generally, early labor contractions are more than 10 minutes apart. ? Active labor. This is when contractions get longer, more regular, more frequent, and more intense. ? The transition phase. This is when contractions happen very close together, are very intense, and may last longer than during any other part of labor.  Contractions generally feel mild, infrequent, and irregular at first. They get stronger, more frequent (about every 2-3 minutes), and more regular as you progress from early labor through active labor and transition.  Many women progress through stage 1 naturally, but you may need help to continue making progress. If this happens, your health care provider may talk with you about: ? Rupturing your amniotic sac if it has not ruptured yet. ? Giving you medicine to help make your contractions stronger and more frequent.  Stage 1 ends when your cervix is completely dilated to 4 inches (10 cm) and completely effaced. This happens at the end of the transition phase. Stage 2  Once   your cervix is completely effaced and dilated to 4 inches (10 cm), you may start to feel an urge to push. It is common for the body to naturally take a rest before feeling the urge to push, especially if you received an epidural or certain other pain medicines. This rest period may last for up to 1-2 hours, depending on your unique labor experience.  During stage 2, contractions are generally less painful, because pushing helps relieve contraction pain. Instead of contraction pain, you may feel stretching and burning pain, especially when the widest part of your baby's head passes through the vaginal opening (crowning).  Your health care provider will closely monitor your pushing progress and your baby's progress through the vagina during stage 2.  Your  health care provider may massage the area of skin between your vaginal opening and anus (perineum) or apply warm compresses to your perineum. This helps it stretch as the baby's head starts to crown, which can help prevent perineal tearing. ? In some cases, an incision may be made in your perineum (episiotomy) to allow the baby to pass through the vaginal opening. An episiotomy helps to make the opening of the vagina larger to allow more room for the baby to fit through.  It is very important to breathe and focus so your health care provider can control the delivery of your baby's head. Your health care provider may have you decrease the intensity of your pushing, to help prevent perineal tearing.  After delivery of your baby's head, the shoulders and the rest of the body generally deliver very quickly and without difficulty.  Once your baby is delivered, the umbilical cord may be cut right away, or this may be delayed for 1-2 minutes, depending on your baby's health. This may vary among health care providers, hospitals, and birth centers.  If you and your baby are healthy enough, your baby may be placed on your chest or abdomen to help maintain the baby's temperature and to help you bond with each other. Some mothers and babies start breastfeeding at this time. Your health care team will dry your baby and help keep your baby warm during this time.  Your baby may need immediate care if he or she: ? Showed signs of distress during labor. ? Has a medical condition. ? Was born too early (prematurely). ? Had a bowel movement before birth (meconium). ? Shows signs of difficulty transitioning from being inside the uterus to being outside of the uterus. If you are planning to breastfeed, your health care team will help you begin a feeding. Stage 3  The third stage of labor starts immediately after the birth of your baby and ends after you deliver the placenta. The placenta is an organ that develops  during pregnancy to provide oxygen and nutrients to your baby in the womb.  Delivering the placenta may require some pushing, and you may have mild contractions. Breastfeeding can stimulate contractions to help you deliver the placenta.  After the placenta is delivered, your uterus should tighten (contract) and become firm. This helps to stop bleeding in your uterus. To help your uterus contract and to control bleeding, your health care provider may: ? Give you medicine by injection, through an IV tube, by mouth, or through your rectum (rectally). ? Massage your abdomen or perform a vaginal exam to remove any blood clots that are left in your uterus. ? Empty your bladder by placing a thin, flexible tube (catheter) into your bladder. ? Encourage   you to breastfeed your baby. After labor is over, you and your baby will be monitored closely to ensure that you are both healthy until you are ready to go home. Your health care team will teach you how to care for yourself and your baby. This information is not intended to replace advice given to you by your health care provider. Make sure you discuss any questions you have with your health care provider. Document Released: 04/24/2008 Document Revised: 02/03/2016 Document Reviewed: 07/31/2015 Elsevier Interactive Patient Education  2018 [employer]  

## 2017-07-02 ENCOUNTER — Ambulatory Visit (INDEPENDENT_AMBULATORY_CARE_PROVIDER_SITE_OTHER): Admitting: Obstetrics and Gynecology

## 2017-07-02 VITALS — BP 117/74 | HR 93 | Wt 175.8 lb

## 2017-07-02 DIAGNOSIS — Z3493 Encounter for supervision of normal pregnancy, unspecified, third trimester: Secondary | ICD-10-CM

## 2017-07-02 LAB — POCT URINALYSIS DIPSTICK
Bilirubin, UA: NEGATIVE
GLUCOSE UA: NEGATIVE
Ketones, UA: NEGATIVE
NITRITE UA: NEGATIVE
PROTEIN UA: NEGATIVE
RBC UA: NEGATIVE
SPEC GRAV UA: 1.01 (ref 1.010–1.025)
UROBILINOGEN UA: 0.2 U/dL
pH, UA: 6.5 (ref 5.0–8.0)

## 2017-07-02 NOTE — Progress Notes (Signed)
Rob-discussed IOL at term, will see in 6 days with growth scan

## 2017-07-02 NOTE — Progress Notes (Signed)
ROB- pt is having a lot of pelvic pressure, some low back pain

## 2017-07-03 ENCOUNTER — Other Ambulatory Visit: Payer: Self-pay | Admitting: Obstetrics and Gynecology

## 2017-07-03 DIAGNOSIS — Z369 Encounter for antenatal screening, unspecified: Secondary | ICD-10-CM

## 2017-07-05 ENCOUNTER — Inpatient Hospital Stay: Admission: RE | Admit: 2017-07-05 | Source: Ambulatory Visit | Admitting: *Deleted

## 2017-07-08 ENCOUNTER — Other Ambulatory Visit

## 2017-07-08 ENCOUNTER — Other Ambulatory Visit: Payer: Self-pay

## 2017-07-08 ENCOUNTER — Inpatient Hospital Stay
Admission: EM | Admit: 2017-07-08 | Discharge: 2017-07-11 | DRG: 807 | Disposition: A | Discharge status: Home / Self Care | Attending: Obstetrics and Gynecology | Admitting: Obstetrics and Gynecology

## 2017-07-08 ENCOUNTER — Encounter: Admitting: Certified Nurse Midwife

## 2017-07-08 DIAGNOSIS — O99824 Streptococcus B carrier state complicating childbirth: Secondary | ICD-10-CM | POA: Diagnosis present

## 2017-07-08 DIAGNOSIS — Z87891 Personal history of nicotine dependence: Secondary | ICD-10-CM

## 2017-07-08 DIAGNOSIS — B951 Streptococcus, group B, as the cause of diseases classified elsewhere: Secondary | ICD-10-CM

## 2017-07-08 DIAGNOSIS — Z3A40 40 weeks gestation of pregnancy: Secondary | ICD-10-CM | POA: Diagnosis not present

## 2017-07-08 DIAGNOSIS — Z3483 Encounter for supervision of other normal pregnancy, third trimester: Secondary | ICD-10-CM | POA: Diagnosis present

## 2017-07-08 HISTORY — DX: Other specified health status: Z78.9

## 2017-07-08 LAB — CBC
HEMATOCRIT: 37.2 % (ref 35.0–47.0)
HEMOGLOBIN: 12.7 g/dL (ref 12.0–16.0)
MCH: 29.3 pg (ref 26.0–34.0)
MCHC: 34.1 g/dL (ref 32.0–36.0)
MCV: 85.9 fL (ref 80.0–100.0)
Platelets: 252 10*3/uL (ref 150–440)
RBC: 4.33 MIL/uL (ref 3.80–5.20)
RDW: 14.4 % (ref 11.5–14.5)
WBC: 9.7 10*3/uL (ref 3.6–11.0)

## 2017-07-08 LAB — TYPE AND SCREEN
ABO/RH(D): O POS
Antibody Screen: NEGATIVE

## 2017-07-08 MED ORDER — FENTANYL CITRATE (PF) 100 MCG/2ML IJ SOLN
50.0000 ug | INTRAMUSCULAR | Status: DC | PRN
  Administered 2017-07-09: 100 ug via INTRAVENOUS
  Filled 2017-07-08: qty 2

## 2017-07-08 MED ORDER — LIDOCAINE HCL (PF) 1 % IJ SOLN: 30.0000 mL | INTRAMUSCULAR | Status: DC | PRN

## 2017-07-08 MED ORDER — MISOPROSTOL 200 MCG PO TABS
ORAL_TABLET | ORAL | Status: AC
  Filled 2017-07-08: qty 4

## 2017-07-08 MED ORDER — SODIUM CHLORIDE 0.9 % IV SOLN
2.0000 g | Freq: Once | INTRAVENOUS | Status: AC
  Administered 2017-07-08: 2 g via INTRAVENOUS
  Filled 2017-07-08: qty 2000

## 2017-07-08 MED ORDER — OXYTOCIN 40 UNITS IN LACTATED RINGERS INFUSION - SIMPLE MED: 2.5000 [IU]/h | INTRAVENOUS | Status: DC

## 2017-07-08 MED ORDER — TERBUTALINE SULFATE 1 MG/ML IJ SOLN: 0.2500 mg | Freq: Once | INTRAMUSCULAR | Status: DC | PRN

## 2017-07-08 MED ORDER — OXYTOCIN 10 UNIT/ML IJ SOLN
INTRAMUSCULAR | Status: AC
Start: 2017-07-08 — End: 2017-07-09
  Filled 2017-07-08: qty 2

## 2017-07-08 MED ORDER — LACTATED RINGERS IV SOLN: INTRAVENOUS | Status: DC

## 2017-07-08 MED ORDER — OXYTOCIN 40 UNITS IN LACTATED RINGERS INFUSION - SIMPLE MED
INTRAVENOUS | Status: AC
  Filled 2017-07-08: qty 1000

## 2017-07-08 MED ORDER — LACTATED RINGERS IV SOLN
500.0000 mL | INTRAVENOUS | Status: DC | PRN
  Administered 2017-07-09: 1000 mL via INTRAVENOUS

## 2017-07-08 MED ORDER — OXYCODONE-ACETAMINOPHEN 5-325 MG PO TABS: 1.0000 | ORAL_TABLET | ORAL | Status: DC | PRN

## 2017-07-08 MED ORDER — ACETAMINOPHEN 325 MG PO TABS: 650.0000 mg | ORAL_TABLET | ORAL | Status: DC | PRN

## 2017-07-08 MED ORDER — AMPICILLIN SODIUM 2 G IJ SOLR: 2.0000 g | Freq: Four times a day (QID) | INTRAMUSCULAR | Status: DC

## 2017-07-08 MED ORDER — ONDANSETRON HCL 4 MG/2ML IJ SOLN: 4.0000 mg | Freq: Four times a day (QID) | INTRAMUSCULAR | Status: DC | PRN

## 2017-07-08 MED ORDER — OXYCODONE-ACETAMINOPHEN 5-325 MG PO TABS: 2.0000 | ORAL_TABLET | ORAL | Status: DC | PRN

## 2017-07-08 MED ORDER — OXYTOCIN BOLUS FROM INFUSION
500.0000 mL | Freq: Once | INTRAVENOUS | Status: AC
  Administered 2017-07-09: 500 mL via INTRAVENOUS

## 2017-07-08 MED ORDER — SOD CITRATE-CITRIC ACID 500-334 MG/5ML PO SOLN: 30.0000 mL | ORAL | Status: DC | PRN

## 2017-07-08 MED ORDER — OXYTOCIN 40 UNITS IN LACTATED RINGERS INFUSION - SIMPLE MED
1.0000 m[IU]/min | INTRAVENOUS | Status: DC
Start: 2017-07-08 — End: 2017-07-09
  Administered 2017-07-08: 2 m[IU]/min via INTRAVENOUS

## 2017-07-08 MED ORDER — LACTATED RINGERS IV SOLN
INTRAVENOUS | Status: DC
  Administered 2017-07-08: 15:00:00 via INTRAVENOUS

## 2017-07-08 MED ORDER — SODIUM CHLORIDE 0.9 % IV SOLN
1.0000 g | INTRAVENOUS | Status: DC
  Administered 2017-07-08 – 2017-07-09 (×3): 1 g via INTRAVENOUS
  Filled 2017-07-08 (×5): qty 1000

## 2017-07-08 MED ORDER — AMMONIA AROMATIC IN INHA
RESPIRATORY_TRACT | Status: AC
  Filled 2017-07-08: qty 10

## 2017-07-08 MED ORDER — LIDOCAINE HCL (PF) 1 % IJ SOLN
INTRAMUSCULAR | Status: AC
  Filled 2017-07-08: qty 30

## 2017-07-08 NOTE — H&P (Signed)
Obstetric History and Physical  Katherine Hansen is a 31 y.o. [id number] with IUP at 4422w3d presenting with irregular mild contractions for elective IOL. Patient states she has been having  irregular, every 2-5 minutes contractions, none vaginal bleeding, intact membranes, with active fetal movement.    [facility]  Pregnancy complications or risks:none  Prenatal labs and studies: ABO, Rh: O/Positive/-- (05/14 [id number]) Antibody: Negative (05/14 0952) Rubella: 1.03 (05/14 0952) RPR: Non Reactive (05/14 0952)  HBsAg: Negative (05/14 0952)  HIV:   negative JWJ:XBJYNWGNGBS:Positive (11/14 1117) 1 hr Glucola  normal Genetic screening normal Anatomy US normal  Past Medical History:  Diagnosis Date  . Medical history non-contributory     Past Surgical History:  Procedure Laterality Date  . NO PAST SURGERIES    . none      OB History  Gravida Para Term Preterm AB Living  3 2 2     2   SAB TAB Ectopic Multiple Live Births          2    # Outcome Date GA Lbr Len/2nd Weight Sex Delivery Anes PTL Lv  3 Current           2 Term 08/27/09 4331w0d  8 lb 9 oz (3.884 kg) M Vag-Spont None  LIV  1 Term 02/18/07 3231w0d  7 lb 4 oz (3.289 kg) F Vag-Spont EPI  LIV      Social History   Socioeconomic History  . Marital status: Married    Spouse name: None  . Number of children: None  . Years of education: None  . Highest education level: None  Social Needs  . Financial resource strain: None  . Food insecurity - worry: None  . Food insecurity - inability: None  . Transportation needs - medical: None  . Transportation needs - non-medical: None  Occupational History  . None  Tobacco Use  . Smoking status: Former Smoker    Types: Cigarettes    Last attempt to quit: 07/08/2006    Years since quitting: 11.0  . Smokeless tobacco: Never Used  Substance and Sexual Activity  . Alcohol use: No  . Drug use: No  . Sexual activity: Yes    Birth control/protection: None    Comment:  Husband vasectomy  Other Topics Concern  . None  Social History Narrative  . None    Family History  Problem Relation Age of Onset  . Migraines Mother   . Rheum arthritis Maternal Grandmother   . Migraines Maternal Grandmother   . Rashes / Skin problems Daughter        Ezcema    Medications Prior to Admission  Medication Sig Dispense Refill Last Dose  . Magnesium Oxide 400 MG CAPS Take 1 capsule (400 mg total) by mouth 2 (two) times daily. 60 capsule 1 Past Month at Unknown time  . Prenatal Vit-Fe Fumarate-FA (MULTIVITAMIN-PRENATAL) 27-0.8 MG TABS tablet Take 1 tablet by mouth daily at 12 noon.   07/07/2017 at Unknown time  . ranitidine (ZANTAC) 150 MG capsule Take 1 capsule (150 mg total) by mouth 2 (two) times daily. 60 capsule 2 Past Week at Unknown time    Allergies  Allergen Reactions  . Septra [Sulfamethoxazole-Trimethoprim]   . Tetracycline Swelling    Review of Systems: Negative except for what is mentioned in HPI.  Physical Exam: BP 120/70   Pulse 85   Temp 98.3 F (36.8 C) (Oral)   Resp 16   Ht 5\' 4"  (1.626  m)   Wt 175 lb (79.4 kg)   LMP 09/28/2016 (Exact Date)   BMI 30.04 kg/m  GENERAL: Well-developed, well-nourished female in no acute distress.  LUNGS: Clear to auscultation bilaterally.  HEART: Regular rate and rhythm. ABDOMEN: Soft, nontender, nondistended, gravid. EXTREMITIES: Nontender, ,mild edema, 2+ distal pulses. Cervical Exam: Dilation: 3 Effacement (%): 50 Station: -2 Exam by:: [employer] RN FHT:  Baseline rate 144 bpm   Variability moderate  Accelerations present   Decelerations none Contractions: Every 2-3 mins   Pertinent Labs/Studies:   No results found for this or any previous visit (from the past 24 hour(s)).  Assessment : Katherine Hansen is a 31 y.o. G3P2002 at 9649w3d being admitted for elective induction oflabor.  Plan: Labor:  Induction with pitocin per protocol FWB: Reassuring fetal heart tracing.  GBS positive Delivery plan:  Hopeful for vaginal delivery  Jaiden Wahab, CNM [facility]

## 2017-07-09 ENCOUNTER — Other Ambulatory Visit: Payer: Self-pay

## 2017-07-09 DIAGNOSIS — Z3483 Encounter for supervision of other normal pregnancy, third trimester: Secondary | ICD-10-CM

## 2017-07-09 DIAGNOSIS — Z3A40 40 weeks gestation of pregnancy: Secondary | ICD-10-CM

## 2017-07-09 LAB — CBC
HCT: 33.7 % — ABNORMAL LOW (ref 35.0–47.0)
HEMOGLOBIN: 11.5 g/dL — AB (ref 12.0–16.0)
MCH: 29.4 pg (ref 26.0–34.0)
MCHC: 34.3 g/dL (ref 32.0–36.0)
MCV: 85.7 fL (ref 80.0–100.0)
PLATELETS: 245 10*3/uL (ref 150–440)
RBC: 3.92 MIL/uL (ref 3.80–5.20)
RDW: 14.1 % (ref 11.5–14.5)
WBC: 16.9 10*3/uL — ABNORMAL HIGH (ref 3.6–11.0)

## 2017-07-09 MED ORDER — OXYCODONE-ACETAMINOPHEN 5-325 MG PO TABS: 1.0000 | ORAL_TABLET | ORAL | Status: DC | PRN

## 2017-07-09 MED ORDER — TETANUS-DIPHTH-ACELL PERTUSSIS 5-2.5-18.5 LF-MCG/0.5 IM SUSP: 0.5000 mL | Freq: Once | INTRAMUSCULAR | Status: DC

## 2017-07-09 MED ORDER — ONDANSETRON HCL 4 MG PO TABS: 4.0000 mg | ORAL_TABLET | ORAL | Status: DC | PRN

## 2017-07-09 MED ORDER — ONDANSETRON HCL 4 MG/2ML IJ SOLN: 4.0000 mg | INTRAMUSCULAR | Status: DC | PRN

## 2017-07-09 MED ORDER — IBUPROFEN 600 MG PO TABS
600.0000 mg | ORAL_TABLET | Freq: Four times a day (QID) | ORAL | Status: DC
  Administered 2017-07-09 – 2017-07-11 (×10): 600 mg via ORAL
  Filled 2017-07-09 (×9): qty 1

## 2017-07-09 MED ORDER — DIBUCAINE 1 % RE OINT: 1.0000 "application " | TOPICAL_OINTMENT | RECTAL | Status: DC | PRN

## 2017-07-09 MED ORDER — IBUPROFEN 600 MG PO TABS
ORAL_TABLET | ORAL | Status: AC
  Filled 2017-07-09: qty 1

## 2017-07-09 MED ORDER — DIPHENHYDRAMINE HCL 25 MG PO CAPS: 25.0000 mg | ORAL_CAPSULE | Freq: Four times a day (QID) | ORAL | Status: DC | PRN

## 2017-07-09 MED ORDER — COCONUT OIL OIL
1.0000 "application " | TOPICAL_OIL | Status: DC | PRN
  Filled 2017-07-09: qty 120

## 2017-07-09 MED ORDER — ACETAMINOPHEN 325 MG PO TABS: 650.0000 mg | ORAL_TABLET | ORAL | Status: DC | PRN

## 2017-07-09 MED ORDER — WITCH HAZEL-GLYCERIN EX PADS: 1.0000 "application " | MEDICATED_PAD | CUTANEOUS | Status: DC | PRN

## 2017-07-09 MED ORDER — PRENATAL MULTIVITAMIN CH
1.0000 | ORAL_TABLET | Freq: Every day | ORAL | Status: DC
  Administered 2017-07-09 – 2017-07-11 (×3): 1 via ORAL
  Filled 2017-07-09 (×3): qty 1

## 2017-07-09 MED ORDER — BENZOCAINE-MENTHOL 20-0.5 % EX AERO: 1.0000 "application " | INHALATION_SPRAY | CUTANEOUS | Status: DC | PRN

## 2017-07-09 MED ORDER — SENNOSIDES-DOCUSATE SODIUM 8.6-50 MG PO TABS
2.0000 | ORAL_TABLET | ORAL | Status: DC
  Administered 2017-07-09 – 2017-07-10 (×2): 2 via ORAL
  Filled 2017-07-09 (×2): qty 2

## 2017-07-09 MED ORDER — OXYCODONE-ACETAMINOPHEN 5-325 MG PO TABS: 2.0000 | ORAL_TABLET | ORAL | Status: DC | PRN

## 2017-07-09 MED ORDER — SIMETHICONE 80 MG PO CHEW: 80.0000 mg | CHEWABLE_TABLET | ORAL | Status: DC | PRN

## 2017-07-10 LAB — RPR: RPR Ser Ql: NONREACTIVE

## 2017-07-10 NOTE — Progress Notes (Signed)
Progress Note - Vaginal Delivery  Katherine Hansen is a 31 y.o. G3P3003 now PP day 1 s/p Vaginal, Spontaneous .   Subjective:  The patient reports no complaints, up ad lib, voiding, tolerating PO and + flatus  Objective:  Vital signs in last 24 hours: Temp:  [97.8 F (36.6 C)-98.4 F (36.9 C)] 97.8 F (36.6 C) (12/12 0749) Pulse Rate:  [66-79] 79 (12/12 0749) Resp:  [18] 18 (12/12 0749) BP: (108-116)/(50-56) 113/50 (12/12 0749) SpO2:  [97 %] 97 % (12/12 0749)  Physical Exam:  General: alert, cooperative, appears stated age and no distress Lochia: appropriate Uterine Fundus: firm DVT Evaluation: No evidence of DVT seen on physical exam. No cords or calf tenderness. No significant calf/ankle edema.    Data Review Recent Labs    07/08/17 1523 07/09/17 0821  HGB 12.7 11.5*  HCT 37.2 33.7*    Assessment/Plan: Active Problems:   Labor and delivery, indication for care   Plan for discharge tomorrow  -- Continue routine PP care.     Doreene Burkennie Veto Macqueen, CNM  07/10/2017 12:15 PM

## 2017-07-10 NOTE — Progress Notes (Signed)
Patient last fed baby at 2:15 a.m.  RN reminded mother that baby should be fed as it is past the 4 hour window.  Mother stated this was her third baby and sleep was more important at this time as baby was not hungry.  Rn again stated that it was important to feed baby every 3-4 hours.  Mother refused at this time.

## 2017-07-11 MED ORDER — DOCUSATE SODIUM 100 MG PO CAPS: 100.0000 mg | ORAL_CAPSULE | Freq: Every day | ORAL | 2 refills | Status: DC | PRN

## 2017-07-11 NOTE — Progress Notes (Signed)
Pt discharged with infant.  Discharge instructions, prescriptions and follow up appointment given to and reviewed with pt. Pt verbalized understanding. Escorted out by auxillary. 

## 2017-07-11 NOTE — Discharge Summary (Signed)
                            Discharge Summary  Date of Admission: 07/08/2017  Date of Discharge: 07/11/2017  Admitting Diagnosis: Induction of labor at 3858w4d  Mode of Delivery: normal spontaneous vaginal delivery              Discharge Diagnosis: No other diagnosis   Intrapartum Procedures: GBS prophylaxis   Post partum procedures: none  Complications: small anterior labia minora, no repair                      Discharge Day SOAP Note:  Progress Note - Vaginal Delivery  Katherine Hansen is a 31 y.o. G3P3003 now PP day 2 s/p Vaginal, Spontaneous . Delivery was uncomplicated  Subjective  The patient has the following complaints: has no unusual complaints  Pain is controlled with current medications.   Patient is urinating without difficulty.  She is ambulating well.    Objective  Vital signs: BP (!) 113/58 (BP Location: Right Arm)   Pulse 77   Temp 97.6 F (36.4 C) (Oral)   Resp 20   Ht 5\' 4"  (1.626 m)   Wt 175 lb (79.4 kg)   LMP 09/28/2016 (Exact Date)   SpO2 98%   Breastfeeding? Unknown   BMI 30.04 kg/m   Physical Exam: Gen: NAD Fundus Fundal Tone: Firm  Lochia Amount: Small  Perineum Appearance: Intact     Data Review Labs: CBC Latest Ref Rng & Units 07/09/2017 07/08/2017 05/27/2017  WBC 3.6 - 11.0 K/uL 16.9(H) 9.7 8.6  Hemoglobin 12.0 - 16.0 g/dL 11.5(L) 12.7 11.4  Hematocrit 35.0 - 47.0 % 33.7(L) 37.2 34.8  Platelets 150 - 440 K/uL 245 252 232   O POS  Assessment/Plan  Active Problems:   Labor and delivery, indication for care    Plan for discharge today.   Discharge Instructions: Per After Visit Summary. Activity: Advance as tolerated. Pelvic rest for 6 weeks.  Also refer to After Visit Summary Diet: Regular Medications: Allergies as of 07/11/2017      Reactions   Septra [sulfamethoxazole-trimethoprim]    Tetracycline Swelling      Medication List    TAKE these medications   docusate sodium 100 MG capsule Commonly known as:   COLACE Take 1 capsule (100 mg total) by mouth daily as needed.   Magnesium Oxide 400 MG Caps Take 1 capsule (400 mg total) by mouth 2 (two) times daily.   multivitamin-prenatal 27-0.8 MG Tabs tablet Take 1 tablet by mouth daily at 12 noon.   ranitidine 150 MG capsule Commonly known as:  ZANTAC Take 1 capsule (150 mg total) by mouth 2 (two) times daily.      Outpatient follow up:  Postpartum contraception: Partner is having a vasectomy   Discharged Condition: stable  Discharged to: home  Newborn Data: Disposition:home with mother  Apgars: APGAR (1 MIN): 8   APGAR (5 MINS): 9   APGAR (10 MINS):    Baby Feeding: Breast    Doreene Burkennie Auden Wettstein, CNM  07/11/2017 8:19 AM

## 2017-08-21 ENCOUNTER — Ambulatory Visit (INDEPENDENT_AMBULATORY_CARE_PROVIDER_SITE_OTHER): Admitting: Certified Nurse Midwife

## 2017-08-21 ENCOUNTER — Encounter: Payer: Self-pay | Admitting: Certified Nurse Midwife

## 2017-08-21 NOTE — Progress Notes (Signed)
Pt is here for a post partum visit. Has not had a period. Has not resumed intercourse. Is breast feeding. Does not want bc. Scored a 5 on depression screening.

## 2017-08-21 NOTE — Progress Notes (Signed)
Subjective:    Katherine Hansen is a [age] y.o. 73P3003 Caucasian female who presents for a postpartum visit. She is 6 weeks postpartum following a spontaneous vaginal delivery at 40.3 gestational weeks. Anesthesia: none. I have fully reviewed the prenatal and intrapartum course. Postpartum course has been normal. Baby's course has been normal. Baby is feeding by breast. Bleeding no bleeding. Bowel function is normal. Bladder function is normal. Patient is not sexually active. Last sexual activity: prior to delivery. Contraception method is none. Postpartum depression screening: negative. Score 5.  Last pap 11/2006 and was [facility]-repeat this year.  The following portions of the patient's history were reviewed and updated as appropriate: allergies, current medications, past medical history, past surgical history and problem list.  Review of Systems Pertinent items are noted in HPI.   Vitals:   08/21/17 1557  BP: 108/63  Pulse: 75  Weight: 151 lb 4 oz (68.6 kg)  Height: 5\' 4"  (1.626 m)   No LMP recorded.  Objective:   General:  alert, cooperative and no distress   Breasts:  deferred, no complaints  Lungs: clear to auscultation bilaterally  Heart:  regular rate and rhythm  Abdomen: soft, nontender   Vulva: normal  Vagina: normal vagina  Cervix:  closed  Corpus: Well-involuted  Adnexa:  Non-palpable  Rectal Exam: no hemorrhoids        Assessment:   Postpartum exam 6 wks s/p NSVD Breast feeding Depression screening-negative Contraception counseling -husband having vasectomy  Plan:  : vasectomy, encouraged use of condoms until procedure completed Follow up in: 4 months for annual/pap smear or earlier if needed  Doreene BurkeAnnie Tag Wurtz, CNM

## 2017-08-21 NOTE — Patient Instructions (Signed)
Preventive Care 18-39 Years, Female Preventive care refers to lifestyle choices and visits with your health care provider that can promote health and wellness. What does preventive care include?  A yearly physical exam. This is also called an annual well check.  Dental exams once or twice a year.  Routine eye exams. Ask your health care provider how often you should have your eyes checked.  Personal lifestyle choices, including: ? Daily care of your teeth and gums. ? Regular physical activity. ? Eating a healthy diet. ? Avoiding tobacco and drug use. ? Limiting alcohol use. ? Practicing safe sex. ? Taking vitamin and mineral supplements as recommended by your health care provider. What happens during an annual well check? The services and screenings done by your health care provider during your annual well check will depend on your age, overall health, lifestyle risk factors, and family history of disease. Counseling Your health care provider may ask you questions about your:  Alcohol use.  Tobacco use.  Drug use.  Emotional well-being.  Home and relationship well-being.  Sexual activity.  Eating habits.  Work and work Statistician.  Method of birth control.  Menstrual cycle.  Pregnancy history.  Screening You may have the following tests or measurements:  Height, weight, and BMI.  Diabetes screening. This is done by checking your blood sugar (glucose) after you have not eaten for a while (fasting).  Blood pressure.  Lipid and cholesterol levels. These may be checked every 5 years starting at age 66.  Skin check.  Hepatitis C blood test.  Hepatitis B blood test.  Sexually transmitted disease (STD) testing.  BRCA-related cancer screening. This may be done if you have a family history of breast, ovarian, tubal, or peritoneal cancers.  Pelvic exam and Pap test. This may be done every 3 years starting at age 40. Starting at age 59, this may be done every 5  years if you have a Pap test in combination with an HPV test.  Discuss your test results, treatment options, and if necessary, the need for more tests with your health care provider. Vaccines Your health care provider may recommend certain vaccines, such as:  Influenza vaccine. This is recommended every year.  Tetanus, diphtheria, and acellular pertussis (Tdap, Td) vaccine. You may need a Td booster every 10 years.  Varicella vaccine. You may need this if you have not been vaccinated.  HPV vaccine. If you are 69 or younger, you may need three doses over 6 months.  Measles, mumps, and rubella (MMR) vaccine. You may need at least one dose of MMR. You may also need a second dose.  Pneumococcal 13-valent conjugate (PCV13) vaccine. You may need this if you have certain conditions and were not previously vaccinated.  Pneumococcal polysaccharide (PPSV23) vaccine. You may need one or two doses if you smoke cigarettes or if you have certain conditions.  Meningococcal vaccine. One dose is recommended if you are age 27-21 years and a first-year college student living in a residence hall, or if you have one of several medical conditions. You may also need additional booster doses.  Hepatitis A vaccine. You may need this if you have certain conditions or if you travel or work in places where you may be exposed to hepatitis A.  Hepatitis B vaccine. You may need this if you have certain conditions or if you travel or work in places where you may be exposed to hepatitis B.  Haemophilus influenzae type b (Hib) vaccine. You may need this if  you have certain risk factors.  Talk to your health care provider about which screenings and vaccines you need and how often you need them. This information is not intended to replace advice given to you by your health care provider. Make sure you discuss any questions you have with your health care provider. Document Released: 09/11/2001 Document Revised: 04/04/2016  Document Reviewed: 05/17/2015 Elsevier Interactive Patient Education  [employer].

## 2017-08-27 ENCOUNTER — Ambulatory Visit
Admission: EM | Admit: 2017-08-27 | Discharge: 2017-08-27 | Disposition: A | Discharge status: Home / Self Care | Attending: Family Medicine | Admitting: Family Medicine

## 2017-08-27 ENCOUNTER — Encounter: Payer: Self-pay | Admitting: Certified Nurse Midwife

## 2017-08-27 ENCOUNTER — Encounter: Payer: Self-pay | Admitting: Emergency Medicine

## 2017-08-27 ENCOUNTER — Other Ambulatory Visit: Payer: Self-pay

## 2017-08-27 ENCOUNTER — Other Ambulatory Visit: Payer: Self-pay | Admitting: Certified Nurse Midwife

## 2017-08-27 DIAGNOSIS — L0501 Pilonidal cyst with abscess: Secondary | ICD-10-CM | POA: Diagnosis not present

## 2017-08-27 MED ORDER — AMOXICILLIN-POT CLAVULANATE 875-125 MG PO TABS: 1.0000 | ORAL_TABLET | Freq: Two times a day (BID) | ORAL | 0 refills | Status: DC

## 2017-08-27 MED ORDER — CEPHALEXIN 500 MG PO CAPS: 500.0000 mg | ORAL_CAPSULE | Freq: Four times a day (QID) | ORAL | 0 refills | Status: AC

## 2017-08-27 NOTE — ED Provider Notes (Signed)
[facility]    CSN: [id number] Arrival date & time: 08/27/17  1305  History   Chief Complaint Chief Complaint  Patient presents with  . Abscess   HPI  32 year old female with a prior history of abscess/pilonidal abscess presents with abscess.  Patient noted pain, firmness on Thursday.  Area is located to the right of the gluteal cleft.  Firm, painful.  Currently 4/10 in severity.  No drainage.  No known exacerbating or relieving factors.  No other associated symptoms.  No other complaints or concerns at this time.  Of note, patient is currently breast-feeding.  PMH: Hx of abscess  Past Surgical History:  Procedure Laterality Date  . NO PAST SURGERIES    . none      OB History    Gravida Para Term Preterm AB Living   3 3 3     3    SAB TAB Ectopic Multiple Live Births         0 3       Home Medications    Prior to Admission medications   Medication Sig Start Date End Date Taking? Authorizing Provider  Prenatal Vit-Fe Fumarate-FA (MULTIVITAMIN-PRENATAL) 27-0.8 MG TABS tablet Take 1 tablet by mouth daily at 12 noon.   Yes [provider]  cephALEXin (KEFLEX) 500 MG capsule Take 1 capsule (500 mg total) by mouth 4 (four) times daily for 7 days. 08/27/17 09/03/17  Tommie Samsook, Kaushal Vannice G, DO    Family History Family History  Problem Relation Age of Onset  . Migraines Mother   . Rheum arthritis Maternal Grandmother   . Migraines Maternal Grandmother   . Rashes / Skin problems Daughter        Ezcema    Social History Social History   Tobacco Use  . Smoking status: Former Smoker    Types: Cigarettes    Last attempt to quit: 07/08/2006    Years since quitting: 11.1  . Smokeless tobacco: Never Used  Substance Use Topics  . Alcohol use: No  . Drug use: No     Allergies   Septra [sulfamethoxazole-trimethoprim] and Tetracycline   Review of Systems Review of Systems  Constitutional: Negative.   Skin:       Abscess.   Physical Exam Triage Vital  Signs ED Triage Vitals  Enc Vitals Group     BP 08/27/17 1331 113/60     Pulse Rate 08/27/17 1331 78     Resp 08/27/17 1331 14     Temp 08/27/17 1331 98 F (36.7 C)     Temp Source 08/27/17 1331 Oral     SpO2 08/27/17 1331 99 %     Weight 08/27/17 1330 151 lb (68.5 kg)     Height 08/27/17 1330 5\' 4"  (1.626 m)     Head Circumference --      Peak Flow --      Pain Score 08/27/17 1329 4     Pain Loc --      Pain Edu? --      Excl. in GC? --    Updated Vital Signs BP 113/60 (BP Location: Left Arm)   Pulse 78   Temp 98 F (36.7 C) (Oral)   Resp 14   Ht 5\' 4"  (1.626 m)   Wt 151 lb (68.5 kg)   SpO2 99%   Breastfeeding? Yes   BMI 25.92 kg/m   Physical Exam  Constitutional: She is oriented to person, place, and time. She appears well-developed and well-nourished. No distress.  HENT:  Head: Normocephalic and atraumatic.  Eyes: Conjunctivae are normal.  Pulmonary/Chest: Effort normal. No respiratory distress.  Neurological: She is alert and oriented to person, place, and time.  Skin:  Firm area of induration with surrounding erythema and slight fluctuance to the right of the gluteal cleft.  Psychiatric: Her behavior is normal.  Flat affect.  Nursing note and vitals reviewed.  UC Treatments / Results  Labs (all labs ordered are listed, but only abnormal results are displayed) Labs Reviewed - No data to display  EKG  EKG Interpretation None       Radiology No results found.  Procedures Procedures (including critical care time)  Medications Ordered in UC Medications - No data to display   Initial Impression / Assessment and Plan / UC Course  I have reviewed the triage vital signs and the nursing notes.  Pertinent labs & imaging results that were available during my care of the patient were reviewed by me and considered in my medical decision making (see chart for details).     32 year old female presents with abscess.  Concern for pilonidal abscess.  I  informed her that I do not prefer to perform incision and drainage of pilonidal abscesses as they often are deep and recurrent.  I advised treatment with antibiotic and follow-up with surgery if persists.  Patient okay with this plan.  Treating with Keflex.  Final Clinical Impressions(s) / UC Diagnoses   Final diagnoses:  Pilonidal abscess    ED Discharge Orders        Ordered    cephALEXin (KEFLEX) 500 MG capsule  4 times daily     08/27/17 1409     Controlled Substance Prescriptions [place] Controlled Substance Registry consulted? Not Applicable   Tommie Sams, DO 08/27/17 1418

## 2017-08-27 NOTE — Progress Notes (Signed)
[facility], she has a pilonidal cyst.    Doreene BurkeAnnie Jessyca Sloan, CNM

## 2017-08-27 NOTE — Discharge Instructions (Signed)
If not improving, call surgeon.  Antibiotic as prescribed.  Take care  Dr. Adriana Simasook

## 2017-08-27 NOTE — ED Triage Notes (Signed)
Patient c/o abscess on her buttock since Thursday.

## 2017-08-29 ENCOUNTER — Encounter: Payer: Self-pay | Admitting: Emergency Medicine

## 2017-08-29 ENCOUNTER — Ambulatory Visit
Admission: EM | Admit: 2017-08-29 | Discharge: 2017-08-29 | Disposition: A | Discharge status: Home / Self Care | Attending: Family Medicine | Admitting: Family Medicine

## 2017-08-29 ENCOUNTER — Other Ambulatory Visit: Payer: Self-pay

## 2017-08-29 DIAGNOSIS — L0501 Pilonidal cyst with abscess: Secondary | ICD-10-CM

## 2017-08-29 NOTE — ED Provider Notes (Signed)
[facility]    CSN: [id number] Arrival date & time: 08/29/17  1507     History   Chief Complaint Chief Complaint  Patient presents with  . Cyst    APPT    HPI  32 year old female presents with continued symptoms regarding an ongoing abscess.  Patient was recently seen by me on 1/29.  Patient diagnosed with abscess, likely pilonidal.  I recommended treatment with antibiotics and follow-up with general surgery given the often extensive nature of pilonidal abscess.  Patient has taken the antibiotic as prescribed.  She continues to have issues.  She states that she is having worsening pain.  She states that she has had no improvement. No reported fever although she has had sweats.  No drainage from the wound.  Patient is very upset about this.  She desires incision and drainage today.  PMH: Hx of abscess  Past Surgical History:  Procedure Laterality Date  . NO PAST SURGERIES    . none      OB History    Gravida Para Term Preterm AB Living   3 3 3     3    SAB TAB Ectopic Multiple Live Births         0 3     Home Medications    Prior to Admission medications   Medication Sig Start Date End Date Taking? Authorizing Provider  cephALEXin (KEFLEX) 500 MG capsule Take 1 capsule (500 mg total) by mouth 4 (four) times daily for 7 days. 08/27/17 09/03/17 Yes Jasmain Ahlberg, Verdis FredericksonJayce G, DO  Prenatal Vit-Fe Fumarate-FA (MULTIVITAMIN-PRENATAL) 27-0.8 MG TABS tablet Take 1 tablet by mouth daily at 12 noon.   Yes [provider]    Family History Family History  Problem Relation Age of Onset  . Migraines Mother   . Rheum arthritis Maternal Grandmother   . Migraines Maternal Grandmother   . Rashes / Skin problems Daughter        Ezcema    Social History Social History   Tobacco Use  . Smoking status: Former Smoker    Types: Cigarettes    Last attempt to quit: 07/08/2006    Years since quitting: 11.1  . Smokeless tobacco: Never Used  Substance Use Topics  . Alcohol  use: No  . Drug use: No     Allergies   Septra [sulfamethoxazole-trimethoprim] and Tetracycline   Review of Systems Review of Systems  Constitutional: Negative for fever.  Skin:       Abscess.   Physical Exam Triage Vital Signs ED Triage Vitals  Enc Vitals Group     BP 08/29/17 1520 124/63     Pulse Rate 08/29/17 1520 (!) 108     Resp 08/29/17 1520 16     Temp 08/29/17 1520 98.6 F (37 C)     Temp Source 08/29/17 1520 Oral     SpO2 08/29/17 1520 99 %     Weight 08/29/17 1519 151 lb (68.5 kg)     Height 08/29/17 1519 5\' 4"  (1.626 m)     Head Circumference --      Peak Flow --      Pain Score 08/29/17 1520 5     Pain Loc --      Pain Edu? --      Excl. in GC? --    Updated Vital Signs BP 124/63 (BP Location: Right Arm)   Pulse (!) 108   Temp 98.6 F (37 C) (Oral)   Resp 16   Ht 5'  4" (1.626 m)   Wt 151 lb (68.5 kg)   SpO2 99%   BMI 25.92 kg/m     Physical Exam  Constitutional: She is oriented to person, place, and time. She appears well-developed.  In pain.  HENT:  Head: Normocephalic and atraumatic.  Eyes: Conjunctivae are normal.  Pulmonary/Chest: Effort normal. No respiratory distress.  Neurological: She is alert and oriented to person, place, and time.  Skin:     Large area of induration and erythema noted at labelled location.  Superficial blister noted on top.  Exquisitely tender to palpation.  Fluctuant.  Psychiatric:  Flat affect. Depressed mood.  Nursing note and vitals reviewed.  UC Treatments / Results  Labs (all labs ordered are listed, but only abnormal results are displayed) Labs Reviewed - No data to display  EKG  EKG Interpretation None       Radiology No results found.  Procedures Incision and Drainage Date/Time: 08/29/2017 4:27 PM Performed by: Tommie Sams, DO Authorized by: Tommie Sams, DO   Consent:    Consent obtained:  Verbal Location:    Type:  Abscess Anesthesia (see MAR for exact dosages):    Anesthesia  method:  Local infiltration   Local anesthetic:  Lidocaine 1% WITH epi Procedure type:    Complexity:  Simple Procedure details:    Incision types:  Stab incision   Scalpel blade:  11   Wound management:  Probed and deloculated   Drainage:  Purulent   Drainage amount:  Copious   Wound treatment:  Wound left open   Packing materials:  1/2 in gauze Post-procedure details:    Patient tolerance of procedure:  Tolerated well, no immediate complications   (including critical care time)  Medications Ordered in UC Medications - No data to display   Initial Impression / Assessment and Plan / UC Course  I have reviewed the triage vital signs and the nursing notes.  Pertinent labs & imaging results that were available during my care of the patient were reviewed by me and considered in my medical decision making (see chart for details).    32 year old female presents with a pilonidal abscess.  Incision and drainage performed today.  Copious purulent drainage.  Wound packed.  Patient will finish antibiotic course.  I will see her back on Saturday for removal of packing.  Final Clinical Impressions(s) / UC Diagnoses   Final diagnoses:  Pilonidal abscess    ED Discharge Orders    None     Controlled Substance Prescriptions [place] Controlled Substance Registry consulted? Not Applicable   Tommie Sams, DO 08/29/17 1629

## 2017-08-29 NOTE — Discharge Instructions (Signed)
You can change the dressing.   Try not to get it wet.  Return on Saturday for packing removal.  Take care  Dr. Adriana Simasook

## 2017-08-29 NOTE — ED Triage Notes (Signed)
Patient in today with continued pain and boil on buttock. Patient was seen for same on 08/27/17.

## 2017-08-31 ENCOUNTER — Ambulatory Visit
Admission: EM | Admit: 2017-08-31 | Discharge: 2017-08-31 | Disposition: A | Discharge status: Home / Self Care | Attending: Family Medicine | Admitting: Family Medicine

## 2017-08-31 ENCOUNTER — Encounter: Payer: Self-pay | Admitting: Gynecology

## 2017-08-31 DIAGNOSIS — L0501 Pilonidal cyst with abscess: Secondary | ICD-10-CM

## 2017-08-31 DIAGNOSIS — Z5189 Encounter for other specified aftercare: Secondary | ICD-10-CM

## 2017-08-31 NOTE — ED Provider Notes (Signed)
[facility]    CSN: [id number] Arrival date & time: 08/31/17  1355  History   Chief Complaint Chief Complaint  Patient presents with  . Wound Check   HPI   32 year old female presents for packing removal and wound check following recent incision and drainage of pilonidal abscess.  Patient reports that she is improved but not fully improved.  She is here for packing removal.  No current drainage of pus.  She has been taking care of the wound appropriately.  Pain is currently 4/10 in severity.  She has no other complaints or concerns at this time.  Social History Social History   Tobacco Use  . Smoking status: Former Smoker    Types: Cigarettes    Last attempt to quit: 07/08/2006    Years since quitting: 11.1  . Smokeless tobacco: Never Used  Substance Use Topics  . Alcohol use: No  . Drug use: No    Allergies   Septra [sulfamethoxazole-trimethoprim] and Tetracycline   Review of Systems Review of Systems  Constitutional: Negative for fever.  Skin: Positive for wound.   Physical Exam Triage Vital Signs ED Triage Vitals  Enc Vitals Group     BP 08/31/17 1410 (!) 113/45     Pulse Rate 08/31/17 1410 (!) 102     Resp 08/31/17 1410 16     Temp 08/31/17 1410 98.5 F (36.9 C)     Temp Source 08/31/17 1410 Oral     SpO2 08/31/17 1410 99 %     Weight 08/31/17 1411 151 lb (68.5 kg)     Height --      Head Circumference --      Peak Flow --      Pain Score 08/31/17 1411 4     Pain Loc --      Pain Edu? --      Excl. in GC? --    Updated Vital Signs BP (!) 113/45 (BP Location: Left Arm)   Pulse (!) 102   Temp 98.5 F (36.9 C) (Oral)   Resp 16   Wt 151 lb (68.5 kg)   SpO2 99%   Breastfeeding? Yes   BMI 25.92 kg/m     Physical Exam  Constitutional: She appears well-developed and well-nourished. No distress.  Skin:  Patient still has quite a bit of induration.  No purulent drainage.  Still remains tender to palpation.  Packing removed and repacked  today.  Nursing note and vitals reviewed.  UC Treatments / Results  Labs (all labs ordered are listed, but only abnormal results are displayed) Labs Reviewed - No data to display  EKG  EKG Interpretation None       Radiology No results found.  Procedures Procedures (including critical care time)  Medications Ordered in UC Medications - No data to display   Initial Impression / Assessment and Plan / UC Course  I have reviewed the triage vital signs and the nursing notes.  Pertinent labs & imaging results that were available during my care of the patient were reviewed by me and considered in my medical decision making (see chart for details).     32 year old female presents for reevaluation regarding recent pilonidal abscess.  Packing removed and repacked today.  Patient can pull the packing in 48 hours and then discontinue.  I advised her to finish antibiotic course.  If she continues to improve she can continue to monitor.  If she does not she should call the surgeon.  Patient in agreement.  Final Clinical Impressions(s) / UC Diagnoses   Final diagnoses:  Wound check, abscess    ED Discharge Orders    None     Controlled Substance Prescriptions [place] Controlled Substance Registry consulted? Not Applicable   Tommie SamsCook, Jacqueleen Pulver G, DO 08/31/17 1439

## 2017-08-31 NOTE — ED Triage Notes (Signed)
Patient is here today to have her packing remove and repack at buttocks region. Per patient abscess I &D at right buttock.

## 2017-08-31 NOTE — Discharge Instructions (Signed)
Remove packing in 2 days.  Rest. Finish antibiotic.  If does not continue to improve, you will need to see General surgery.  Take care  Dr. Adriana Simasook

## 2017-12-20 ENCOUNTER — Encounter: Payer: Self-pay | Admitting: Certified Nurse Midwife

## 2017-12-20 ENCOUNTER — Ambulatory Visit (INDEPENDENT_AMBULATORY_CARE_PROVIDER_SITE_OTHER): Admitting: Certified Nurse Midwife

## 2017-12-20 VITALS — BP 103/69 | HR 82 | Ht 64.0 in | Wt 151.4 lb

## 2017-12-20 DIAGNOSIS — Z3041 Encounter for surveillance of contraceptive pills: Secondary | ICD-10-CM

## 2017-12-20 DIAGNOSIS — Z01419 Encounter for gynecological examination (general) (routine) without abnormal findings: Secondary | ICD-10-CM | POA: Diagnosis not present

## 2017-12-20 LAB — POCT URINE PREGNANCY: Preg Test, Ur: NEGATIVE

## 2017-12-20 MED ORDER — NORETHINDRONE 0.35 MG PO TABS: 1.0000 | ORAL_TABLET | Freq: Every day | ORAL | 11 refills | Status: DC

## 2017-12-20 NOTE — Patient Instructions (Signed)
Preventive Care 18-39 Years, Female Preventive care refers to lifestyle choices and visits with your health care provider that can promote health and wellness. What does preventive care include?  A yearly physical exam. This is also called an annual well check.  Dental exams once or twice a year.  Routine eye exams. Ask your health care provider how often you should have your eyes checked.  Personal lifestyle choices, including: ? Daily care of your teeth and gums. ? Regular physical activity. ? Eating a healthy diet. ? Avoiding tobacco and drug use. ? Limiting alcohol use. ? Practicing safe sex. ? Taking vitamin and mineral supplements as recommended by your health care provider. What happens during an annual well check? The services and screenings done by your health care provider during your annual well check will depend on your age, overall health, lifestyle risk factors, and family history of disease. Counseling Your health care provider may ask you questions about your:  Alcohol use.  Tobacco use.  Drug use.  Emotional well-being.  Home and relationship well-being.  Sexual activity.  Eating habits.  Work and work Statistician.  Method of birth control.  Menstrual cycle.  Pregnancy history.  Screening You may have the following tests or measurements:  Height, weight, and BMI.  Diabetes screening. This is done by checking your blood sugar (glucose) after you have not eaten for a while (fasting).  Blood pressure.  Lipid and cholesterol levels. These may be checked every 5 years starting at age 66.  Skin check.  Hepatitis C blood test.  Hepatitis B blood test.  Sexually transmitted disease (STD) testing.  BRCA-related cancer screening. This may be done if you have a family history of breast, ovarian, tubal, or peritoneal cancers.  Pelvic exam and Pap test. This may be done every 3 years starting at age 40. Starting at age 59, this may be done every 5  years if you have a Pap test in combination with an HPV test.  Discuss your test results, treatment options, and if necessary, the need for more tests with your health care provider. Vaccines Your health care provider may recommend certain vaccines, such as:  Influenza vaccine. This is recommended every year.  Tetanus, diphtheria, and acellular pertussis (Tdap, Td) vaccine. You may need a Td booster every 10 years.  Varicella vaccine. You may need this if you have not been vaccinated.  HPV vaccine. If you are 69 or younger, you may need three doses over 6 months.  Measles, mumps, and rubella (MMR) vaccine. You may need at least one dose of MMR. You may also need a second dose.  Pneumococcal 13-valent conjugate (PCV13) vaccine. You may need this if you have certain conditions and were not previously vaccinated.  Pneumococcal polysaccharide (PPSV23) vaccine. You may need one or two doses if you smoke cigarettes or if you have certain conditions.  Meningococcal vaccine. One dose is recommended if you are age 27-21 years and a first-year college student living in a residence hall, or if you have one of several medical conditions. You may also need additional booster doses.  Hepatitis A vaccine. You may need this if you have certain conditions or if you travel or work in places where you may be exposed to hepatitis A.  Hepatitis B vaccine. You may need this if you have certain conditions or if you travel or work in places where you may be exposed to hepatitis B.  Haemophilus influenzae type b (Hib) vaccine. You may need this if  you have certain risk factors.  Talk to your health care provider about which screenings and vaccines you need and how often you need them. This information is not intended to replace advice given to you by your health care provider. Make sure you discuss any questions you have with your health care provider. Document Released: 09/11/2001 Document Revised: 04/04/2016  Document Reviewed: 05/17/2015 Elsevier Interactive Patient Education  [employer].

## 2017-12-20 NOTE — Progress Notes (Signed)
GYNECOLOGY ANNUAL PREVENTATIVE CARE ENCOUNTER NOTE  Subjective:   Katherine Hansen is a 32 y.o. [phone] female here for a routine annual gynecologic exam.  Current complaints: none.   Denies abnormal vaginal bleeding, discharge, pelvic pain, problems with intercourse or other gynecologic concerns.    Gynecologic History Patient's last menstrual period was 12/16/2017 (exact date). Contraception: none Last Pap: 12/25/16  Results were: Ascu with negative HPV Last mammogram: n/a. Obstetric History OB History  Gravida Para Term Preterm AB Living  SAB TAB Ectopic Multiple Live Births        0 3    # Outcome Date GA Lbr Len/2nd Weight Sex Delivery Anes PTL Lv  3 Term 07/09/17 [redacted]w[redacted]d / 00:11 7 lb 7.9 oz (3.4 kg) F Vag-Spont None  LIV  2 Term 08/27/09 [redacted]w[redacted]d  8 lb 9 oz (3.884 kg) M Vag-Spont None  LIV  1 Term 02/18/07 [redacted]w[redacted]d  7 lb 4 oz (3.289 kg) F Vag-Spont EPI  LIV    Past Medical History:  Diagnosis Date  . Medical history non-contributory     Past Surgical History:  Procedure Laterality Date  . NO PAST SURGERIES    . none      Current Outpatient Medications on File Prior to Visit  Medication Sig Dispense Refill  . Prenatal Vit-Fe Fumarate-FA (MULTIVITAMIN-PRENATAL) 27-0.8 MG TABS tablet Take 1 tablet by mouth daily at 12 noon.     No current facility-administered medications on file prior to visit.     Allergies  Allergen Reactions  . Septra [Sulfamethoxazole-Trimethoprim]   . Tetracycline Swelling    Social History   Socioeconomic History  . Marital status: Married    Spouse name: Not on file  . Number of children: Not on file  . Years of education: Not on file  . Highest education level: Not on file  Occupational History  . Not on file  Social Needs  . Financial resource strain: Not on file  . Food insecurity:    Worry: Not on file    Inability: Not on file  . Transportation needs:    Medical: Not on file    Non-medical: Not on file  Tobacco  Use  . Smoking status: Former Smoker    Types: Cigarettes    Last attempt to quit: 07/08/2006    Years since quitting: 11.4  . Smokeless tobacco: Never Used  Substance and Sexual Activity  . Alcohol use: No  . Drug use: No  . Sexual activity: Yes    Birth control/protection: Coitus interruptus  Lifestyle  . Physical activity:    Days per week: 0 days    Minutes per session: 0 min  . Stress: Not on file  Relationships  . Social connections:    Talks on phone: Not on file    Gets together: Not on file    Attends religious service: Not on file    Active member of club or organization: Not on file    Attends meetings of clubs or organizations: Not on file    Relationship status: Not on file  . Intimate partner violence:    Fear of current or ex partner: Not on file    Emotionally abused: Not on file    Physically abused: Not on file    Forced sexual activity: Not on file  Other Topics Concern  . Not on file  Social History Narrative  . Not on file    Family History  Problem Relation Age of Onset  . Migraines Mother   . Rheum arthritis Maternal Grandmother   . Migraines Maternal Grandmother   . Rashes / Skin problems Daughter        Rudie Meyer    The following portions of the patient's history were reviewed and updated as appropriate: allergies, current medications, past family history, past medical history, past social history, past surgical history and problem list.  Review of Systems Pertinent items noted in HPI and remainder of comprehensive ROS otherwise negative.   Objective:  BP 103/69   Pulse 82   Ht  (1.626 m)   Wt 151 lb 6.4 oz (68.7 kg)   LMP 12/16/2017 (Exact Date)   Breastfeeding? Yes   BMI 25.99 kg/m  CONSTITUTIONAL: Well-developed, well-nourished female in no acute distress.  HENT:  Normocephalic, atraumatic, External right and left ear normal. Oropharynx is clear and moist EYES: Conjunctivae and EOM are normal. Pupils are equal, round, and  reactive to light. No scleral icterus.  NECK: Normal range of motion, supple, no masses.  Normal thyroid.  SKIN: Skin is warm and dry. No rash noted. Not diaphoretic. No erythema. No pallor. MUSCULOSKELETAL: Normal range of motion. No tenderness.  No cyanosis, clubbing, or edema.  2+ distal pulses. NEUROLOGIC: Alert and oriented to person, place, and time. Normal reflexes, muscle tone coordination. No cranial nerve deficit noted. PSYCHIATRIC: Normal mood and affect. Normal behavior. Normal judgment and thought content. CARDIOVASCULAR: Normal heart rate noted, regular rhythm RESPIRATORY: Clear to auscultation bilaterally. Effort and breath sounds normal, no problems with respiration noted. BREASTS: Symmetric in size. No masses, skin changes, nipple drainage, or lymphadenopathy. Lactating ABDOMEN: Soft, normal bowel sounds, no distention noted.  No tenderness, rebound or guarding.  PELVIC: Normal appearing external genitalia; normal appearing vaginal mucosa and cervix.  No abnormal discharge noted.  Pap smear obtained.  Normal uterine size, no other palpable masses, no uterine or adnexal tenderness.    Assessment and Plan:  1. Well woman exam with routine gynecological exam - IGP, [id number]  2. Encounter for surveillance of contraceptive pills - POCT urine pregnancy  Will follow up results of pap smear and manage accordingly. Routine preventative health maintenance measures emphasized. Please refer to After Visit Summary for other counseling recommendations.    Doreene Burke, CNM

## 2017-12-25 LAB — IGP, COBASHPV16/18
HPV 16: NEGATIVE
HPV 18: NEGATIVE
HPV OTHER HR TYPES: NEGATIVE
PAP Smear Comment: 0

## 2017-12-25 LAB — SPECIMEN STATUS REPORT

## 2017-12-31 ENCOUNTER — Encounter (INDEPENDENT_AMBULATORY_CARE_PROVIDER_SITE_OTHER): Payer: Self-pay

## 2018-10-10 ENCOUNTER — Other Ambulatory Visit: Payer: Self-pay

## 2018-10-10 ENCOUNTER — Ambulatory Visit (INDEPENDENT_AMBULATORY_CARE_PROVIDER_SITE_OTHER): Admitting: Nurse Practitioner

## 2018-10-10 ENCOUNTER — Encounter: Payer: Self-pay | Admitting: Nurse Practitioner

## 2018-10-10 VITALS — BP 110/44 | HR 77 | Temp 99.0°F | Ht 62.0 in | Wt 142.0 lb

## 2018-10-10 DIAGNOSIS — E663 Overweight: Secondary | ICD-10-CM | POA: Diagnosis not present

## 2018-10-10 DIAGNOSIS — Z7689 Persons encountering health services in other specified circumstances: Secondary | ICD-10-CM | POA: Diagnosis not present

## 2018-10-10 NOTE — Patient Instructions (Addendum)
Katherine Hansen,   Thank you for coming in to clinic today.  1. Continue your work healthy diet and lifestyle. - recommend no more than 10 lbs weight loss, but about 3-5 lbs for normal weight.  Please schedule a follow-up appointment with Wilhelmina Mcardle, AGNP. Return if symptoms worsen or fail to improve.  If you have any other questions or concerns, please feel free to call the clinic or send a message through MyChart. You may also schedule an earlier appointment if necessary.  You will receive a survey after today's visit either digitally by e-mail or paper by [employer]. Your experiences and feedback matter to [place].  Please respond so we know how we are doing as we provide care for you.   Wilhelmina Mcardle, DNP, AGNP-BC Adult Gerontology Nurse Practitioner [facility], [facility]

## 2018-10-10 NOTE — Progress Notes (Signed)
Subjective:    Patient ID: Katherine Hansen, female    DOB: 1986/07/14, 33 y.o.   MRN: [id number]  Katherine Hansen is a 33 y.o. female presenting on 10/10/2018 for New Patient (Initial Visit) (wants to establish care, has no other issues today. )   HPI Establish Care New Provider Pt last seen by PCP many years ago.  Obtain records from OB-GYN in [facility] for recent preventative care.  - Not yet having regular periods.  Last doc visit was last may.  Delivered 06/2017.  Continues breastfeeding.  Overweight Patient is currently not happy with size/appearance.  Goal current clothing fits better is patient's goal.  Patient continues to breastfeed, but has not continued losing weight from her pregnancy with delivery 06/2017.  Past Medical History:  Diagnosis Date  . Labor and delivery, indication for care 07/08/2017  . Medical history non-contributory   . Pilonidal cyst    Past Surgical History:  Procedure Laterality Date  . NO PAST SURGERIES    . none     Social History   Socioeconomic History  . Marital status: Married    Spouse name: Not on file  . Number of children: Not on file  . Years of education: Not on file  . Highest education level: Not on file  Occupational History  . Not on file  Social Needs  . Financial resource strain: Not on file  . Food insecurity:    Worry: Not on file    Inability: Not on file  . Transportation needs:    Medical: Not on file    Non-medical: Not on file  Tobacco Use  . Smoking status: Former Smoker    Packs/day: 0.00    Years: 0.00    Pack years: 0.00    Types: Cigarettes    Last attempt to quit: 07/08/2006    Years since quitting: 12.2  . Smokeless tobacco: Never Used  Substance and Sexual Activity  . Alcohol use: No  . Drug use: No  . Sexual activity: Yes    Birth control/protection: Coitus interruptus, Pill  Lifestyle  . Physical activity:    Days per week: 0 days    Minutes per session: 0 min  . Stress: Not on file   Relationships  . Social connections:    Talks on phone: Not on file    Gets together: Not on file    Attends religious service: Not on file    Active member of club or organization: Not on file    Attends meetings of clubs or organizations: Not on file    Relationship status: Not on file  . Intimate partner violence:    Fear of current or ex partner: Not on file    Emotionally abused: Not on file    Physically abused: Not on file    Forced sexual activity: Not on file  Other Topics Concern  . Not on file  Social History Narrative  . Not on file   Family History  Problem Relation Age of Onset  . Migraines Mother   . Rheum arthritis Maternal Grandmother   . Migraines Maternal Grandmother   . Depression Maternal Grandmother   . Rashes / Skin problems Daughter        Ezcema  . Depression Maternal Aunt   . Alcohol abuse Father    Current Outpatient Medications on File Prior to Visit  Medication Sig  . norethindrone (ORTHO MICRONOR) 0.35 MG tablet Take 1 tablet (0.35 mg total) by mouth  daily.  . Prenatal Vit-Fe Fumarate-FA (MULTIVITAMIN-PRENATAL) 27-0.8 MG TABS tablet Take 1 tablet by mouth daily at 12 noon.   No current facility-administered medications on file prior to visit.     Review of Systems  Constitutional: Negative for activity change, appetite change and fatigue.  Respiratory: Negative for cough and shortness of breath.   Cardiovascular: Negative for chest pain, palpitations and leg swelling.  Gastrointestinal: Negative for constipation, diarrhea, nausea and vomiting.  Genitourinary: Negative for dysuria, frequency and urgency.  Musculoskeletal: Negative for arthralgias and myalgias.  Skin: Negative for rash.  Neurological: Negative for dizziness and headaches.  Psychiatric/Behavioral: Negative for dysphoric mood. The patient is not nervous/anxious.    Per HPI unless specifically indicated above     Objective:    BP (!) 110/44 (BP Location: Left Arm, Patient  Position: Sitting, Cuff Size: Normal)   Pulse 77   Temp 99 F (37.2 C)   Ht 5\' 2"  (1.575 m)   Wt 142 lb (64.4 kg)   Breastfeeding Yes   BMI 25.97 kg/m   Wt Readings from Last 3 Encounters:  10/10/18 142 lb (64.4 kg)  12/20/17 151 lb 6.4 oz (68.7 kg)  08/31/17 151 lb (68.5 kg)    Physical Exam Vitals signs and nursing note reviewed.  Constitutional:      General: She is not in acute distress.    Appearance: She is well-developed.  HENT:     Head: Normocephalic and atraumatic.     Right Ear: External ear normal.     Left Ear: External ear normal.     Nose: Nose normal.  Eyes:     Conjunctiva/sclera: Conjunctivae normal.     Pupils: Pupils are equal, round, and reactive to light.  Neck:     Musculoskeletal: Normal range of motion and neck supple.     Thyroid: No thyromegaly.     Vascular: No JVD.     Trachea: No tracheal deviation.  Cardiovascular:     Rate and Rhythm: Normal rate and regular rhythm.     Heart sounds: Normal heart sounds. No murmur. No friction rub. No gallop.   Pulmonary:     Effort: Pulmonary effort is normal. No respiratory distress.     Breath sounds: Normal breath sounds.  Musculoskeletal: Normal range of motion.  Lymphadenopathy:     Cervical: No cervical adenopathy.  Skin:    General: Skin is warm and dry.  Neurological:     Mental Status: She is alert and oriented to person, place, and time.     Cranial Nerves: No cranial nerve deficit.  Psychiatric:        Behavior: Behavior normal.        Thought Content: Thought content normal.        Judgment: Judgment normal.    Results for orders placed or performed in visit on 12/20/17  Specimen status report  Result Value Ref Range   specimen status report Comment   POCT urine pregnancy  Result Value Ref Range   Preg Test, Ur Negative Negative  IGP, cobasHPV16/18  Result Value Ref Range   DIAGNOSIS: Comment    Specimen adequacy: Comment    Clinician Provided ICD10 Comment    Performed by:  Comment    PAP Smear Comment .    Note: Comment    Test Methodology Comment    HPV other hr types Negative Negative   HPV 16 Negative Negative   HPV 18 Negative Negative      Assessment & Plan:  Problem List Items Addressed This Visit    None    Visit Diagnoses    Overweight (BMI 25.0-29.9)    -  Primary   Encounter to establish care          Previous PCP was at many years ago.  Records from OBGYN are reviewed.  Past medical, family, and surgical history reviewed w/ pt.  Overweight, BMI 25 Stable.  Patient with desire for additional weight control.  Plan: 1. Encouraged healthy diet with good vegetables 2. Encouraged regular activity/exercise 3. Recommend no more than 10 lbs weight loss for healthy weight. 4. FOLLOW-UP prn.    Follow up plan: Return if symptoms worsen or fail to improve.  Wilhelmina Mcardle, DNP, AGPCNP-BC Adult Gerontology Primary Care Nurse Practitioner [facility] 10/10/2018, 4:05 PM

## 2018-10-17 ENCOUNTER — Encounter: Payer: Self-pay | Admitting: Nurse Practitioner

## 2018-12-23 ENCOUNTER — Encounter: Admitting: Certified Nurse Midwife

## 2019-01-05 ENCOUNTER — Other Ambulatory Visit: Payer: Self-pay

## 2019-01-05 MED ORDER — NORETHINDRONE 0.35 MG PO TABS: 1.0000 | ORAL_TABLET | Freq: Every day | ORAL | 0 refills | Status: DC

## 2019-02-05 ENCOUNTER — Telehealth: Payer: Self-pay | Admitting: Certified Nurse Midwife

## 2019-02-05 NOTE — Telephone Encounter (Signed)
The patient called and stated that she needs a refill of her medication sent to norethindrone (ORTHO MICRONOR) 0.35 MG tablet [[id number]]. The patient stated that her pharmacy is awaiting approval for the medication. Please advise.

## 2019-02-06 ENCOUNTER — Telehealth: Payer: Self-pay

## 2019-02-06 MED ORDER — NORETHINDRONE 0.35 MG PO TABS: 1.0000 | ORAL_TABLET | Freq: Every day | ORAL | 0 refills | Status: DC

## 2019-02-06 NOTE — Telephone Encounter (Signed)
Refill sent along with mychart message

## 2019-02-18 ENCOUNTER — Other Ambulatory Visit

## 2019-02-18 ENCOUNTER — Other Ambulatory Visit: Payer: Self-pay

## 2019-02-18 DIAGNOSIS — R350 Frequency of micturition: Secondary | ICD-10-CM

## 2019-02-20 ENCOUNTER — Other Ambulatory Visit: Payer: Self-pay

## 2019-02-20 LAB — URINE CULTURE

## 2019-02-20 MED ORDER — NITROFURANTOIN MONOHYD MACRO 100 MG PO CAPS: 100.0000 mg | ORAL_CAPSULE | Freq: Two times a day (BID) | ORAL | 0 refills | Status: DC

## 2019-02-20 MED ORDER — PHENAZOPYRIDINE HCL 200 MG PO TABS: 200.0000 mg | ORAL_TABLET | Freq: Three times a day (TID) | ORAL | 0 refills | Status: DC | PRN

## 2019-02-20 NOTE — Telephone Encounter (Signed)
Rx: Macrobid 100 mg  PO BID x 7 days, dispense 14, no refills. May have Pyridium 200 mg PO TID PRN dysuria, dispense 9, no refills. Apologize for delay, other providers are out of office. Thanks, JML

## 2019-02-25 ENCOUNTER — Other Ambulatory Visit: Payer: Self-pay | Admitting: Certified Nurse Midwife

## 2019-02-25 MED ORDER — NITROFURANTOIN MONOHYD MACRO 100 MG PO CAPS: 100.0000 mg | ORAL_CAPSULE | Freq: Two times a day (BID) | ORAL | 0 refills | Status: AC

## 2019-02-25 NOTE — Progress Notes (Signed)
Orders placed for UTI.  Ein Rijo, CNM 

## 2019-03-09 ENCOUNTER — Other Ambulatory Visit: Payer: Self-pay | Admitting: Certified Nurse Midwife

## 2019-03-09 ENCOUNTER — Telehealth: Payer: Self-pay

## 2019-03-09 NOTE — Telephone Encounter (Signed)
Refill sent.

## 2019-03-09 NOTE — Telephone Encounter (Signed)
Coronavirus (COVID-19) Are you at risk?  Are you at risk for the Coronavirus (COVID-19)?  To be considered HIGH RISK for Coronavirus (COVID-19), you have to meet the following criteria:  . Traveled to [place], [place], [place], [place] or [place]; or in the [place] to [place], [place], [place], or [place]; and have fever, cough, and shortness of breath within the last 2 weeks of travel OR . Been in close contact with a person diagnosed with COVID-19 within the last 2 weeks and have fever, cough, and shortness of breath . IF YOU DO NOT MEET THESE CRITERIA, YOU ARE CONSIDERED LOW RISK FOR COVID-19.  What to do if you are HIGH RISK for COVID-19?  . If you are having a medical emergency, call 911. . Seek medical care right away. Before you go to a doctor's office, urgent care or emergency department, call ahead and tell them about your recent travel, contact with someone diagnosed with COVID-19, and your symptoms. You should receive instructions from your physician's office regarding next steps of care.  . When you arrive at healthcare provider, tell the healthcare staff immediately you have returned from visiting [place], [place], [place], [place] or [place]; or traveled in the [place] to [place], [place], [place], or [place]; in the last two weeks or you have been in close contact with a person diagnosed with COVID-19 in the last 2 weeks.   . Tell the health care staff about your symptoms: fever, cough and shortness of breath. . After you have been seen by a medical provider, you will be either: o Tested for (COVID-19) and discharged home on quarantine except to seek medical care if symptoms worsen, and asked to  - Stay home and avoid contact with others until you get your results (4-5 days)  - Avoid travel on public transportation if possible (such as bus, train, or airplane) or o Sent to the Emergency Department by EMS for evaluation, COVID-19 testing, and possible  admission depending on your condition and test results.  What to do if you are LOW RISK for COVID-19?  Reduce your risk of any infection by using the same precautions used for avoiding the common cold or flu:  . Wash your hands often with soap and warm water for at least 20 seconds.  If soap and water are not readily available, use an alcohol-based hand sanitizer with at least 60% alcohol.  . If coughing or sneezing, cover your mouth and nose by coughing or sneezing into the elbow areas of your shirt or coat, into a tissue or into your sleeve (not your hands). . Avoid shaking hands with others and consider head nods or verbal greetings only. . Avoid touching your eyes, nose, or mouth with unwashed hands.  . Avoid close contact with people who are Zanyia Silbaugh. . Avoid places or events with large numbers of people in one location, like concerts or sporting events. . Carefully consider travel plans you have or are making. . If you are planning any travel outside or inside the [place], visit the CDC's Travelers' Health webpage for the latest health notices. . If you have some symptoms but not all symptoms, continue to monitor at home and seek medical attention if your symptoms worsen. . If you are having a medical emergency, call 911.  03/09/19 SCREENING NEG SLS ADDITIONAL HEALTHCARE OPTIONS FOR PATIENTS  [facility] / e-Visit: [email or web address]         [facility]: [phone]    [facility]: [phone]                   [facility]: [phone]  

## 2019-03-10 ENCOUNTER — Other Ambulatory Visit: Payer: Self-pay

## 2019-03-10 ENCOUNTER — Encounter: Payer: Self-pay | Admitting: Certified Nurse Midwife

## 2019-03-10 ENCOUNTER — Ambulatory Visit (INDEPENDENT_AMBULATORY_CARE_PROVIDER_SITE_OTHER): Admitting: Certified Nurse Midwife

## 2019-03-10 VITALS — BP 106/68 | HR 84 | Ht 62.0 in | Wt 144.0 lb

## 2019-03-10 DIAGNOSIS — R6889 Other general symptoms and signs: Secondary | ICD-10-CM

## 2019-03-10 DIAGNOSIS — F419 Anxiety disorder, unspecified: Secondary | ICD-10-CM | POA: Insufficient documentation

## 2019-03-10 DIAGNOSIS — Z01419 Encounter for gynecological examination (general) (routine) without abnormal findings: Secondary | ICD-10-CM

## 2019-03-10 MED ORDER — CITALOPRAM HYDROBROMIDE 20 MG PO TABS: 20.0000 mg | ORAL_TABLET | Freq: Every day | ORAL | 1 refills | Status: DC

## 2019-03-10 NOTE — Progress Notes (Signed)
GYNECOLOGY ANNUAL PREVENTATIVE CARE ENCOUNTER NOTE  History:     Katherine Hansen is a 33 y.o. [id number] female here for a routine annual gynecologic exam.  Current complaints: anxiety and out burst of anger. Pt states she has noticed that the littles things set her off. She remembers her mom being similar and states that she now take medication to help. She would like to try something to help.    Denies abnormal vaginal bleeding, discharge, pelvic pain, problems with intercourse or other gynecologic concerns.    Gynecologic History Patient's last menstrual period was 02/08/2019 (exact date). Contraception: oral progesterone-only contraceptive Last Pap: 12/20/2017. Results were: abnormal with negative HPV Last mammogram: n/a.  Obstetric History OB History  Gravida Para Term Preterm AB Living  3 3 3     3   SAB TAB Ectopic Multiple Live Births        0 3    # Outcome Date GA Lbr Len/2nd Weight Sex Delivery Anes PTL Lv  3 Term 07/09/17 [redacted]w[redacted]d / 00:11 7 lb 7.9 oz (3.4 kg) F Vag-Spont None  LIV  2 Term 08/27/09 [redacted]w[redacted]d  8 lb 9 oz (3.884 kg) M Vag-Spont None  LIV  1 Term 02/18/07 [redacted]w[redacted]d  7 lb 4 oz (3.289 kg) F Vag-Spont EPI  LIV    Past Medical History:  Diagnosis Date  . Labor and delivery, indication for care 07/08/2017  . Medical history non-contributory   . Pilonidal cyst     Past Surgical History:  Procedure Laterality Date  . NO PAST SURGERIES    . none      Current Outpatient Medications on File Prior to Visit  Medication Sig Dispense Refill  . norethindrone (MICRONOR) 0.35 MG tablet TAKE 1 TABLET BY MOUTH DAILY 28 tablet 0  . nitrofurantoin, macrocrystal-monohydrate, (MACROBID) 100 MG capsule Take 1 capsule (100 mg total) by mouth 2 (two) times daily. 14 capsule 0  . phenazopyridine (PYRIDIUM) 200 MG tablet Take 1 tablet (200 mg total) by mouth 3 (three) times daily as needed (Dysuria). 9 tablet 0  . Prenatal Vit-Fe Fumarate-FA (MULTIVITAMIN-PRENATAL) 27-0.8 MG TABS tablet  Take 1 tablet by mouth daily at 12 noon.     No current facility-administered medications on file prior to visit.     Allergies  Allergen Reactions  . Septra [Sulfamethoxazole-Trimethoprim]   . Tetracycline Swelling    Social History:  reports that she quit smoking about 12 years ago. Her smoking use included cigarettes. She smoked 0.00 packs per day for 0.00 years. She has never used smokeless tobacco. She reports that she does not drink alcohol or use drugs.  Family History  Problem Relation Age of Onset  . Migraines Mother   . Rheum arthritis Maternal Grandmother   . Migraines Maternal Grandmother   . Depression Maternal Grandmother   . Rashes / Skin problems Daughter        Ezcema  . Depression Maternal Aunt   . Alcohol abuse Father     The following portions of the patient's history were reviewed and updated as appropriate: allergies, current medications, past family history, past medical history, past social history, past surgical history and problem list.  Review of Systems Pertinent items noted in HPI and remainder of comprehensive ROS otherwise negative.  Physical Exam:  BP 106/68   Pulse 84   Ht 5\' 2"  (1.575 m)   Wt 144 lb (65.3 kg)   LMP 02/08/2019 (Exact Date)   Breastfeeding Yes   BMI 26.34 kg/m  CONSTITUTIONAL:  Well-developed, well-nourished female in no acute distress.  HENT:  Normocephalic, atraumatic, External right and left ear normal. Oropharynx is clear and moist EYES: Conjunctivae and EOM are normal. Pupils are equal, round, and reactive to light. No scleral icterus.  NECK: Normal range of motion, supple, no masses.  Normal thyroid.  SKIN: Skin is warm and dry. No rash noted. Not diaphoretic. No erythema. No pallor. MUSCULOSKELETAL: Normal range of motion. No tenderness.  No cyanosis, clubbing, or edema.  2+ distal pulses. NEUROLOGIC: Alert and oriented to person, place, and time. Normal reflexes, muscle tone coordination. No cranial nerve deficit  noted. PSYCHIATRIC: Normal mood and affect. Normal behavior. Normal judgment and thought content. CARDIOVASCULAR: Normal heart rate noted, regular rhythm RESPIRATORY: Clear to auscultation bilaterally. Effort and breath sounds normal, no problems with respiration noted. BREASTS: Symmetric in size. No masses, skin changes, nipple drainage, or lymphadenopathy. ABDOMEN: Soft, normal bowel sounds, no distention noted.  No tenderness, rebound or guarding.  PELVIC: Normal appearing external genitalia; normal appearing vaginal mucosa and cervix.  No abnormal discharge noted.  Pap smear obtained.  Normal uterine size, no other palpable masses, no uterine or adnexal tenderness.     GAD score 18    Assessment and Plan:  Annual Well Women GYN exam  Pap smear not indicated Mammogram not indicated Lab TSH pannel Medications: refill BC, Celexa  Routine preventative health maintenance measures emphasized. Please refer to After Visit Summary for other counseling recommendations.      Follow up 4-6 weeks for medication check.   Doreene BurkeAnnie Dylan Monforte, CNM

## 2019-03-11 LAB — THYROID PANEL WITH TSH
Free Thyroxine Index: 2.3 (ref 1.2–4.9)
T3 Uptake Ratio: 25 % (ref 24–39)
T4, Total: 9 ug/dL (ref 4.5–12.0)
TSH: 1.81 u[IU]/mL (ref 0.450–4.500)

## 2019-04-01 ENCOUNTER — Other Ambulatory Visit: Payer: Self-pay | Admitting: Certified Nurse Midwife

## 2019-04-21 ENCOUNTER — Ambulatory Visit (INDEPENDENT_AMBULATORY_CARE_PROVIDER_SITE_OTHER): Admitting: Obstetrics and Gynecology

## 2019-04-21 ENCOUNTER — Other Ambulatory Visit: Payer: Self-pay

## 2019-04-21 ENCOUNTER — Encounter: Payer: Self-pay | Admitting: Certified Nurse Midwife

## 2019-04-21 VITALS — BP 106/61 | HR 74 | Ht 62.0 in | Wt 143.4 lb

## 2019-04-21 DIAGNOSIS — F419 Anxiety disorder, unspecified: Secondary | ICD-10-CM

## 2019-04-21 DIAGNOSIS — Z79899 Other long term (current) drug therapy: Secondary | ICD-10-CM

## 2019-04-21 MED ORDER — CITALOPRAM HYDROBROMIDE 20 MG PO TABS: 20.0000 mg | ORAL_TABLET | Freq: Every day | ORAL | 2 refills | Status: DC

## 2019-04-21 NOTE — Progress Notes (Signed)
  Subjective:     Patient ID: Katherine Hansen, female   DOB: [birth year], 32 y.o.   MRN: [id number]  HPI  reports feeling >75% better and denies any side effects from medicine. Desires continuing with them at this time. No concerns.  GAD 7 : Generalized Anxiety Score 04/21/2019 03/10/2019  Nervous, Anxious, on Edge 0 3  Control/stop worrying 0 2  Worry too much - different things 0 2  Trouble relaxing 0 3  Restless 0 3  Easily annoyed or irritable 0 3  Afraid - awful might happen 0 2  Total GAD 7 Score 0 18  Anxiety Difficulty Not difficult at all Very difficult    Review of Systems  All other systems reviewed and are negative.      Objective:   Physical Exam A&Ox4 Well groomed female in no distress Vitals with BMI 04/21/2019 03/10/2019 10/10/2018  Height 5\' 2"  5\' 2"  5\' 2"   Weight 143 lbs 6 oz 144 lbs 142 lbs  BMI 26.22 42.35 36.14  Systolic 431 540 086  Diastolic 61 68 44  Pulse 74 84 77  PE not indicated    Assessment:     Anxiety- under good control with SSRI    Plan:     Will continue with celexa at current dose Refills send it. RTC as needed.  Issac Moure,CNM

## 2019-04-29 ENCOUNTER — Other Ambulatory Visit: Payer: Self-pay | Admitting: Certified Nurse Midwife

## 2019-06-01 ENCOUNTER — Other Ambulatory Visit: Payer: Self-pay | Admitting: Certified Nurse Midwife

## 2019-06-01 MED ORDER — NORETHIN ACE-ETH ESTRAD-FE 1-20 MG-MCG PO TABS: 1.0000 | ORAL_TABLET | Freq: Every day | ORAL | 11 refills | Status: DC

## 2019-06-01 NOTE — Progress Notes (Signed)
Pt is weaning from breastfeeding and would like to resume OCP's orders placed.   Philip Aspen, CNM

## 2020-02-02 ENCOUNTER — Other Ambulatory Visit: Payer: Self-pay

## 2020-02-02 ENCOUNTER — Telehealth: Payer: Self-pay

## 2020-02-02 ENCOUNTER — Other Ambulatory Visit: Payer: Self-pay | Admitting: Certified Nurse Midwife

## 2020-02-02 NOTE — Telephone Encounter (Signed)
mychart message sent to patient

## 2020-03-08 ENCOUNTER — Encounter: Payer: Self-pay | Admitting: Certified Nurse Midwife

## 2020-03-08 ENCOUNTER — Ambulatory Visit (INDEPENDENT_AMBULATORY_CARE_PROVIDER_SITE_OTHER): Admitting: Certified Nurse Midwife

## 2020-03-08 VITALS — BP 99/62 | HR 77 | Ht 62.0 in | Wt 159.6 lb

## 2020-03-08 DIAGNOSIS — Z01419 Encounter for gynecological examination (general) (routine) without abnormal findings: Secondary | ICD-10-CM

## 2020-03-08 MED ORDER — CITALOPRAM HYDROBROMIDE 20 MG PO TABS: 20.0000 mg | ORAL_TABLET | Freq: Every day | ORAL | 3 refills | Status: DC

## 2020-03-08 MED ORDER — NORETHIN ACE-ETH ESTRAD-FE 1-20 MG-MCG PO TABS: 1.0000 | ORAL_TABLET | Freq: Every day | ORAL | 3 refills | Status: DC

## 2020-03-08 NOTE — Patient Instructions (Signed)
Preventive Care 21-34 Years Old, Female Preventive care refers to visits with your health care provider and lifestyle choices that can promote health and wellness. This includes:  A yearly physical exam. This may also be called an annual well check.  Regular dental visits and eye exams.  Immunizations.  Screening for certain conditions.  Healthy lifestyle choices, such as eating a healthy diet, getting regular exercise, not using drugs or products that contain nicotine and tobacco, and limiting alcohol use. What can I expect for my preventive care visit? Physical exam Your health care provider will check your:  Height and weight. This may be used to calculate body mass index (BMI), which tells if you are at a healthy weight.  Heart rate and blood pressure.  Skin for abnormal spots. Counseling Your health care provider may ask you questions about your:  Alcohol, tobacco, and drug use.  Emotional well-being.  Home and relationship well-being.  Sexual activity.  Eating habits.  Work and work environment.  Method of birth control.  Menstrual cycle.  Pregnancy history. What immunizations do I need?  Influenza (flu) vaccine  This is recommended every year. Tetanus, diphtheria, and pertussis (Tdap) vaccine  You may need a Td booster every 10 years. Varicella (chickenpox) vaccine  You may need this if you have not been vaccinated. Human papillomavirus (HPV) vaccine  If recommended by your health care provider, you may need three doses over 6 months. Measles, mumps, and rubella (MMR) vaccine  You may need at least one dose of MMR. You may also need a second dose. Meningococcal conjugate (MenACWY) vaccine  One dose is recommended if you are age 19-21 years and a first-year college student living in a residence hall, or if you have one of several medical conditions. You may also need additional booster doses. Pneumococcal conjugate (PCV13) vaccine  You may need  this if you have certain conditions and were not previously vaccinated. Pneumococcal polysaccharide (PPSV23) vaccine  You may need one or two doses if you smoke cigarettes or if you have certain conditions. Hepatitis A vaccine  You may need this if you have certain conditions or if you travel or work in places where you may be exposed to hepatitis A. Hepatitis B vaccine  You may need this if you have certain conditions or if you travel or work in places where you may be exposed to hepatitis B. Haemophilus influenzae type b (Hib) vaccine  You may need this if you have certain conditions. You may receive vaccines as individual doses or as more than one vaccine together in one shot (combination vaccines). Talk with your health care provider about the risks and benefits of combination vaccines. What tests do I need?  Blood tests  Lipid and cholesterol levels. These may be checked every 5 years starting at age 20.  Hepatitis C test.  Hepatitis B test. Screening  Diabetes screening. This is done by checking your blood sugar (glucose) after you have not eaten for a while (fasting).  Sexually transmitted disease (STD) testing.  BRCA-related cancer screening. This may be done if you have a family history of breast, ovarian, tubal, or peritoneal cancers.  Pelvic exam and Pap test. This may be done every 3 years starting at age 21. Starting at age 30, this may be done every 5 years if you have a Pap test in combination with an HPV test. Talk with your health care provider about your test results, treatment options, and if necessary, the need for more tests.   Follow these instructions at home: Eating and drinking   Eat a diet that includes fresh fruits and vegetables, whole grains, lean protein, and low-fat dairy.  Take vitamin and mineral supplements as recommended by your health care provider.  Do not drink alcohol if: ? Your health care provider tells you not to drink. ? You are  pregnant, may be pregnant, or are planning to become pregnant.  If you drink alcohol: ? Limit how much you have to 0-1 drink a day. ? Be aware of how much alcohol is in your drink. In the [place] one drink equals one 12 oz bottle of beer (355 mL), one 5 oz glass of wine (148 mL), or one 1 oz glass of hard liquor (44 mL). Lifestyle  Take daily care of your teeth and gums.  Stay active. Exercise for at least 30 minutes on 5 or more days each week.  Do not use any products that contain nicotine or tobacco, such as cigarettes, e-cigarettes, and chewing tobacco. If you need help quitting, ask your health care provider.  If you are sexually active, practice safe sex. Use a condom or other form of birth control (contraception) in order to prevent pregnancy and STIs (sexually transmitted infections). If you plan to become pregnant, see your health care provider for a preconception visit. What's next?  Visit your health care provider once a year for a well check visit.  Ask your health care provider how often you should have your eyes and teeth checked.  Stay up to date on all vaccines. This information is not intended to replace advice given to you by your health care provider. Make sure you discuss any questions you have with your health care provider. Document Revised: 03/27/2018 Document Reviewed: 03/27/2018 Elsevier Patient Education  2020 [employer].

## 2020-03-08 NOTE — Progress Notes (Signed)
GYNECOLOGY ANNUAL PREVENTATIVE CARE ENCOUNTER NOTE  History:     Katherine Hansen is a 34 y.o. G45P3003 female here for a routine annual gynecologic exam.  Current complaints: none.   Denies abnormal vaginal bleeding, discharge, pelvic pain, problems with intercourse or other gynecologic concerns.     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   Gynecologic History Patient's last menstrual period was 03/08/2020 (exact date). Contraception: OCP (estrogen/progesterone) Last Pap: 12/20/2017. Results were: normal with negative HPV Last mammogram: n/a .   Obstetric History OB History  Gravida Para Term Preterm AB Living  3 3 3     3   SAB TAB Ectopic Multiple Live Births        0 3    # Outcome Date GA Lbr Len/2nd Weight Sex Delivery Anes PTL Lv  3 Term 07/09/17 [redacted]w[redacted]d / 00:11 7 lb 7.9 oz (3.4 kg) F Vag-Spont None  LIV  2 Term 08/27/09 [redacted]w[redacted]d  8 lb 9 oz (3.884 kg) M Vag-Spont None  LIV  1 Term 02/18/07 [redacted]w[redacted]d  7 lb 4 oz (3.289 kg) F Vag-Spont EPI  LIV    Past Medical History:  Diagnosis Date  . Labor and delivery, indication for care 07/08/2017  . Medical history non-contributory   . Pilonidal cyst     Past Surgical History:  Procedure Laterality Date  . NO PAST SURGERIES    . none      Current Outpatient Medications on File Prior to Visit  Medication Sig Dispense Refill  . citalopram (CELEXA) 20 MG tablet TAKE 1 TABLET(20 MG) BY MOUTH DAILY 90 tablet 0  . norethindrone-ethinyl estradiol (LOESTRIN FE) 1-20 MG-MCG tablet Take 1 tablet by mouth daily. 1 Package 11  . nitrofurantoin, macrocrystal-monohydrate, (MACROBID) 100 MG capsule Take 1 capsule (100 mg total) by mouth 2 (two) times daily. 14 capsule 0  . phenazopyridine (PYRIDIUM) 200 MG tablet Take 1 tablet (200 mg total) by mouth 3 (three) times daily as needed (Dysuria). 9 tablet  0  . Prenatal Vit-Fe Fumarate-FA (MULTIVITAMIN-PRENATAL) 27-0.8 MG TABS tablet Take 1 tablet by mouth daily at 12 noon.     No current facility-administered medications on file prior to visit.    Allergies  Allergen Reactions  . Septra [Sulfamethoxazole-Trimethoprim]   . Tetracycline Swelling    Social History:  reports that she quit smoking about 13 years ago. Her smoking use included cigarettes. She smoked 0.00 packs per day for 0.00 years. She has never used smokeless tobacco. She reports that she does not drink alcohol and does not use drugs.  Family History  Problem Relation Age of Onset  . Migraines Mother   . Rheum arthritis Maternal Grandmother   . Migraines Maternal Grandmother   . Depression Maternal Grandmother   . Rashes / Skin problems Daughter        Ezcema  . Depression Maternal Aunt   . Alcohol abuse Father     The following portions of the patient's history were reviewed and updated as appropriate: allergies, current medications, past family history, past medical history, past social history, past surgical history and problem list.  Review of Systems Pertinent items noted in HPI and remainder of comprehensive ROS otherwise negative.  Physical Exam:  BP 99/62   Pulse 77   Ht 5\' 2"  (1.575 m)   Wt 159 lb 9 oz (72.4 kg)   LMP 03/08/2020 (  Exact Date)   BMI 29.18 kg/m  CONSTITUTIONAL: Well-developed, well-nourished female in no acute distress.  HENT:  Normocephalic, atraumatic, External right and left ear normal. Oropharynx is clear and moist EYES: Conjunctivae and EOM are normal. Pupils are equal, round, and reactive to light. No scleral icterus.  NECK: Normal range of motion, supple, no masses.  Normal thyroid.  SKIN: Skin is warm and dry. No rash noted. Not diaphoretic. No erythema. No pallor. MUSCULOSKELETAL: Normal range of motion. No tenderness.  No cyanosis, clubbing, or edema.  2+ distal pulses. NEUROLOGIC: Alert and oriented to person, place, and time.  Normal reflexes, muscle tone coordination.  PSYCHIATRIC: Normal mood and affect. Normal behavior. Normal judgment and thought content. CARDIOVASCULAR: Normal heart rate noted, regular rhythm RESPIRATORY: Clear to auscultation bilaterally. Effort and breath sounds normal, no problems with respiration noted. BREASTS: Symmetric in size. No masses, tenderness, skin changes, nipple drainage, or lymphadenopathy bilaterally.  ABDOMEN: Soft, no distention noted.  No tenderness, rebound or guarding.  PELVIC: Normal appearing external genitalia and urethral meatus; normal appearing vaginal mucosa and cervix.  No abnormal discharge noted.  Pap smear not indicated.  Normal uterine size, no other palpable masses, no uterine or adnexal tenderness.    Assessment and Plan:    1. Women's annual routine gynecological examination Pap not due until 2024 Mammogram not indicated Labs: declined ( does not like blood draws) Refill: OCP , celexa Referral: none  Routine preventative health maintenance measures emphasized. Please refer to After Visit Summary for other counseling recommendations.      Doreene Burke, CNM  [facility], [facility]

## 2020-05-07 ENCOUNTER — Other Ambulatory Visit: Payer: Self-pay | Admitting: Certified Nurse Midwife

## 2021-01-29 ENCOUNTER — Other Ambulatory Visit: Payer: Self-pay | Admitting: Certified Nurse Midwife

## 2021-01-31 ENCOUNTER — Other Ambulatory Visit: Payer: Self-pay

## 2021-01-31 ENCOUNTER — Ambulatory Visit (INDEPENDENT_AMBULATORY_CARE_PROVIDER_SITE_OTHER): Admitting: Certified Nurse Midwife

## 2021-01-31 VITALS — BP 124/78 | HR 79 | Ht 63.0 in | Wt 171.6 lb

## 2021-01-31 DIAGNOSIS — E669 Obesity, unspecified: Secondary | ICD-10-CM

## 2021-01-31 MED ORDER — CYANOCOBALAMIN 1000 MCG/ML IJ SOLN
1000.0000 ug | Freq: Once | INTRAMUSCULAR | 5 refills | Status: DC
Start: 2021-01-31 — End: 2021-01-31

## 2021-01-31 MED ORDER — NORETHIN ACE-ETH ESTRAD-FE 1-20 MG-MCG PO TABS: 1.0000 | ORAL_TABLET | Freq: Every day | ORAL | 0 refills | Status: DC

## 2021-01-31 MED ORDER — PHENTERMINE HCL 37.5 MG PO CAPS
37.5000 mg | ORAL_CAPSULE | ORAL | 0 refills | Status: DC
Start: 2021-01-31 — End: 2021-02-28

## 2021-01-31 MED ORDER — CYANOCOBALAMIN 1000 MCG/ML IJ SOLN
1000.0000 ug | Freq: Once | INTRAMUSCULAR | Status: AC
  Administered 2021-01-31: 1000 ug via INTRAMUSCULAR

## 2021-01-31 NOTE — Patient Instructions (Signed)
Exercising to Lose Weight Exercise is structured, repetitive physical activity to improve fitness and health. Getting regular exercise is important for everyone. It is especially important if you are overweight. Being overweight increases your risk of heart disease, stroke, diabetes, high blood pressure, and several types of cancer.Reducing your calorie intake and exercising can help you lose weight. Exercise is usually categorized as moderate or vigorous intensity. To lose weight, most people need to do a certain amount of moderate-intensity orvigorous-intensity exercise each week. Moderate-intensity exercise  Moderate-intensity exercise is any activity that gets you moving enough to burn at least three times more energy (calories) than if you were sitting. Examples of moderate exercise include: Walking a mile in 15 minutes. Doing light yard work. Biking at an easy pace. Most people should get at least 150 minutes (2 hours and 30 minutes) a week ofmoderate-intensity exercise to maintain their body weight. Vigorous-intensity exercise Vigorous-intensity exercise is any activity that gets you moving enough to burn at least six times more calories than if you were sitting. When you exercise at this intensity, you should be working hard enough that you are not able tocarry on a conversation. Examples of vigorous exercise include: Running. Playing a team sport, such as football, basketball, and soccer. Jumping rope. Most people should get at least 75 minutes (1 hour and 15 minutes) a week ofvigorous-intensity exercise to maintain their body weight. How can exercise affect me? When you exercise enough to burn more calories than you eat, you lose weight. Exercise also reduces body fat and builds muscle. The more muscle you have, the more calories you burn. Exercise also: Improves mood. Reduces stress and tension. Improves your overall fitness, flexibility, and endurance. Increases bone strength. The  amount of exercise you need to lose weight depends on: Your age. The type of exercise. Any health conditions you have. Your overall physical ability. Talk to your health care provider about how much exercise you need and whattypes of activities are safe for you. What actions can I take to lose weight? Nutrition  Make changes to your diet as told by your health care provider or diet and nutrition specialist (dietitian). This may include: Eating fewer calories. Eating more protein. Eating less unhealthy fats. Eating a diet that includes fresh fruits and vegetables, whole grains, low-fat dairy products, and lean protein. Avoiding foods with added fat, salt, and sugar. Drink plenty of water while you exercise to prevent dehydration or heat stroke.  Activity Choose an activity that you enjoy and set realistic goals. Your health care provider can help you make an exercise plan that works for you. Exercise at a moderate or vigorous intensity most days of the week. The intensity of exercise may vary from person to person. You can tell how intense a workout is for you by paying attention to your breathing and heartbeat. Most people will notice their breathing and heartbeat get faster with more intense exercise. Do resistance training twice each week, such as: Push-ups. Sit-ups. Lifting weights. Using resistance bands. Getting short amounts of exercise can be just as helpful as long structured periods of exercise. If you have trouble finding time to exercise, try to include exercise in your daily routine. Get up, stretch, and walk around every 30 minutes throughout the day. Go for a walk during your lunch break. Park your car farther away from your destination. If you take public transportation, get off one stop early and walk the rest of the way. Make phone calls while standing up and   walking around. Take the stairs instead of elevators or escalators. Wear comfortable clothes and shoes with  good support. Do not exercise so much that you hurt yourself, feel dizzy, or get very short of breath. Where to find more information [place] Department of Health and Human Services: www.hhs.gov Centers for Disease Control and Prevention (CDC): www.cdc.gov Contact a health care provider: Before starting a new exercise program. If you have questions or concerns about your weight. If you have a medical problem that keeps you from exercising. Get help right away if you have any of the following while exercising: Injury. Dizziness. Difficulty breathing or shortness of breath that does not go away when you stop exercising. Chest pain. Rapid heartbeat. Summary Being overweight increases your risk of heart disease, stroke, diabetes, high blood pressure, and several types of cancer. Losing weight happens when you burn more calories than you eat. Reducing the amount of calories you eat in addition to getting regular moderate or vigorous exercise each week helps you lose weight. This information is not intended to replace advice given to you by your health care provider. Make sure you discuss any questions you have with your healthcare provider. Document Revised: 10/26/2019 Document Reviewed: 11/12/2019 Elsevier Patient Education  2022 Elsevier Inc.  

## 2021-01-31 NOTE — Addendum Note (Signed)
Addended by: Lady Deutscher on: 01/31/2021 03:09 PM   Modules accepted: Orders

## 2021-01-31 NOTE — Progress Notes (Signed)
Subjective:  Katherine Hansen is a 35 y.o. [phone] at Unknown being seen today for weight loss management- initial visit.   Patient reports General ROS: negative and reports previous weight loss attempts: Management changes made at the last visit include: dietary changes Onset was gradual over 2 yrs .  Onset followed:  recent pregnancy, and in activity Associated symptoms include: fatigue,  anxiety,  headaches, change in clothing fit. Previous/Current treatment includes: none  Pertinent medical history includes:, anxiety , pregnancy  Risk factors include: toddler, not exercising regularly   The patient has a surgical history of: none Pertinent social history includes: none Lab evaluation has included: CMP hemoglobin A1c, thyroid panel   Past treatment has included: small frequent feedings,  nutrition consult , antidepressant, vitamin B-12 injections, appetite stimulant, exercise management  The following portions of the patient's history were reviewed and updated as appropriate: allergies, current medications, past family history, past medical history, past social history, past surgical history and problem list.   Objective:   Vitals:   01/31/21 1115  BP: 124/78  Pulse: 79  Weight: 171 lb 9.6 oz (77.8 kg)  Height: 5\' 3"  (1.6 m)  Waist: 37 in.   General:  Alert, oriented and cooperative. Patient is in no acute distress.  PE: Well groomed female in no current distress,   Mental Status: Normal mood and affect. Normal behavior. Normal judgment and thought content.   Current BMI: Body mass index is 30.4 kg/m. Wait goal 145   Assessment and Plan:  Obesity  Controlled substance agreement form signed. Discussed goal of exercise 3 times weekly 30 min.  Pt denies any contraindications to use of medication.  Discussed taking 1/2 table 1-2 wks.  Discussed 5% wt loss in 3 months 8.5 lbs   Plan: low carb, High protein diet RX for adipex 37.5 mg daily and B12 .ml monthly, to  start now with first injection given at today's visit. Reviewed side-effects common to both medications and expected outcomes. Increase daily water intake to at least 8 bottle a day, every day.  Goal is to reduse weight by 10% by end of three months, and will re-evaluate then.  RTC in 4 weeks for visit to check weight & BP, and get next B12 injections.    Please refer to After Visit Summary for other counseling recommendations.    , CNM  PMP aware web site reviewed:  No results found for the criteria you have entered. Please make sure you have entered the patient information correctly. It is possible the information you entered is correct but the patient doesn't have any prescription history.      Consider the Low Glycemic Index Diet and 6 smaller meals daily .  This boosts your metabolism and regulates your sugars:   Use the protein bar by Atkins because they have lots of fiber in them  Find the low carb flatbreads, tortillas and pita breads for sandwiches:  Joseph's makes a pita bread and a flat bread , available at [facility] and [employer]; Toufayah makes a low carb flatbread available at [facility] and HT that is 9 net carbs and 100 cal Mission makes a low carb whole wheat tortilla available at [employer] most grocery stores with 6 net carbs and 210 cal  [employer] yogurt can still have a lot of carbs .  Dannon Light N fit has 80 cal and 8 carbs

## 2021-02-01 LAB — TSH: TSH: 1.33 u[IU]/mL (ref 0.450–4.500)

## 2021-02-01 LAB — HEMOGLOBIN A1C
Est. average glucose Bld gHb Est-mCnc: 105 mg/dL
Hgb A1c MFr Bld: 5.3 % (ref 4.8–5.6)

## 2021-02-01 LAB — LIPID PANEL
Chol/HDL Ratio: 5.3 ratio — ABNORMAL HIGH (ref 0.0–4.4)
Cholesterol, Total: 281 mg/dL — ABNORMAL HIGH (ref 100–199)
HDL: 53 mg/dL (ref >39)
LDL Chol Calc (NIH): 190 mg/dL — ABNORMAL HIGH (ref 0–99)
Triglycerides: 201 mg/dL — ABNORMAL HIGH (ref 0–149)
VLDL Cholesterol Cal: 38 mg/dL (ref 5–40)

## 2021-02-01 LAB — COMPREHENSIVE METABOLIC PANEL
ALT: 12 IU/L (ref 0–32)
AST: 23 IU/L (ref 0–40)
Albumin/Globulin Ratio: 1.9 (ref 1.2–2.2)
Albumin: 4.6 g/dL (ref 3.8–4.8)
Alkaline Phosphatase: 92 IU/L (ref 44–121)
BUN/Creatinine Ratio: 13 (ref 9–23)
BUN: 9 mg/dL (ref 6–20)
Bilirubin Total: 0.3 mg/dL (ref 0.0–1.2)
CO2: 21 mmol/L (ref 20–29)
Calcium: 9.4 mg/dL (ref 8.7–10.2)
Chloride: 101 mmol/L (ref 96–106)
Creatinine, Ser: 0.7 mg/dL (ref 0.57–1.00)
Globulin, Total: 2.4 g/dL (ref 1.5–4.5)
Glucose: 74 mg/dL (ref 65–99)
Potassium: 4.5 mmol/L (ref 3.5–5.2)
Sodium: 138 mmol/L (ref 134–144)
Total Protein: 7 g/dL (ref 6.0–8.5)
eGFR: 116 mL/min/{1.73_m2} (ref >59)

## 2021-02-06 ENCOUNTER — Other Ambulatory Visit: Payer: Self-pay

## 2021-02-06 MED ORDER — NORETHIN ACE-ETH ESTRAD-FE 1-20 MG-MCG PO TABS: 1.0000 | ORAL_TABLET | Freq: Every day | ORAL | 0 refills | Status: DC

## 2021-02-28 ENCOUNTER — Other Ambulatory Visit: Payer: Self-pay

## 2021-02-28 ENCOUNTER — Encounter: Payer: Self-pay | Admitting: Neurology

## 2021-02-28 ENCOUNTER — Ambulatory Visit (INDEPENDENT_AMBULATORY_CARE_PROVIDER_SITE_OTHER): Admitting: Certified Nurse Midwife

## 2021-02-28 ENCOUNTER — Encounter: Payer: Self-pay | Admitting: Certified Nurse Midwife

## 2021-02-28 VITALS — BP 120/79 | HR 98 | Ht 63.0 in | Wt 165.5 lb

## 2021-02-28 DIAGNOSIS — Z01419 Encounter for gynecological examination (general) (routine) without abnormal findings: Secondary | ICD-10-CM | POA: Diagnosis not present

## 2021-02-28 DIAGNOSIS — Z7689 Persons encountering health services in other specified circumstances: Secondary | ICD-10-CM | POA: Diagnosis not present

## 2021-02-28 DIAGNOSIS — Z8669 Personal history of other diseases of the nervous system and sense organs: Secondary | ICD-10-CM

## 2021-02-28 MED ORDER — PHENTERMINE HCL 37.5 MG PO CAPS: 37.5000 mg | ORAL_CAPSULE | ORAL | 0 refills | Status: DC

## 2021-02-28 MED ORDER — CYANOCOBALAMIN 1000 MCG/ML IJ SOLN
1000.0000 ug | Freq: Once | INTRAMUSCULAR | Status: AC
  Administered 2021-02-28: 1000 ug via INTRAMUSCULAR

## 2021-02-28 MED ORDER — NORETHIN ACE-ETH ESTRAD-FE 1-20 MG-MCG PO TABS: 1.0000 | ORAL_TABLET | Freq: Every day | ORAL | 0 refills | Status: DC

## 2021-02-28 MED ORDER — CITALOPRAM HYDROBROMIDE 20 MG PO TABS: 20.0000 mg | ORAL_TABLET | Freq: Every day | ORAL | 3 refills | Status: DC

## 2021-02-28 NOTE — Progress Notes (Signed)
GYNECOLOGY ANNUAL PREVENTATIVE CARE ENCOUNTER NOTE  History:     Katherine Hansen is a 35 y.o. G33P3003 female here for a routine annual gynecologic exam, BP and weight check.  Current complaints: worsening of her migraine headaches.   Denies abnormal vaginal bleeding, discharge, pelvic pain, problems with intercourse or other gynecologic concerns.     Social Relationship: married  Living:spouse and children Work:  Exercise: daily walking x 30 min and 12 min grow with [employer] video  Smoke/Alcohol/drug use: denies use   Gynecologic History Patient's last menstrual period was 02/20/2021 (exact date). Contraception: OCP (estrogen/progesterone) Last Pap: 12/20/2017. Results were: normal with negative HPV Last mammogram: n/a .   The pregnancy intention screening data noted above was reviewed. Potential methods of contraception were discussed. The patient elected to proceed with OCP.   Obstetric History OB History  Gravida Para Term Preterm AB Living  3 3 3     3   SAB IAB Ectopic Multiple Live Births        0 3    # Outcome Date GA Lbr Len/2nd Weight Sex Delivery Anes PTL Lv  3 Term 07/09/17 [redacted]w[redacted]d / 00:11 7 lb 7.9 oz (3.4 kg) F Vag-Spont None  LIV  2 Term 08/27/09 [redacted]w[redacted]d  8 lb 9 oz (3.884 kg) M Vag-Spont None  LIV  1 Term 02/18/07 [redacted]w[redacted]d  7 lb 4 oz (3.289 kg) F Vag-Spont EPI  LIV    Past Medical History:  Diagnosis Date   Labor and delivery, indication for care 07/08/2017   Medical history non-contributory    Pilonidal cyst   Migraine headaches  Past Surgical History:  Procedure Laterality Date   NO PAST SURGERIES     none      Current Outpatient Medications on File Prior to Visit  Medication Sig Dispense Refill   citalopram (CELEXA) 20 MG tablet Take 1 tablet (20 mg total) by mouth daily. 90 tablet 3   norethindrone-ethinyl estradiol-FE (BLISOVI FE 1/20) 1-20 MG-MCG tablet Take 1 tablet by mouth daily. 28 tablet 0   norethindrone-ethinyl estradiol-FE (LOESTRIN FE) 1-20  MG-MCG tablet Take 1 tablet by mouth daily. 30 tablet 0   phentermine 37.5 MG capsule Take 1 capsule (37.5 mg total) by mouth every morning. 30 capsule 0   No current facility-administered medications on file prior to visit.    Allergies  Allergen Reactions   Septra [Sulfamethoxazole-Trimethoprim]    Tetracycline Swelling    Social History:  reports that she quit smoking about 14 years ago. Her smoking use included cigarettes. She has never used smokeless tobacco. She reports that she does not drink alcohol and does not use drugs.  Family History  Problem Relation Age of Onset   Migraines Mother    Rheum arthritis Maternal Grandmother    Migraines Maternal Grandmother    Depression Maternal Grandmother    Rashes / Skin problems Daughter        Ezcema   Depression Maternal Aunt    Alcohol abuse Father     The following portions of the patient's history were reviewed and updated as appropriate: allergies, current medications, past family history, past medical history, past social history, past surgical history and problem list.  Review of Systems Pertinent items noted in HPI and remainder of comprehensive ROS otherwise negative.  Physical Exam:  BP 120/79   Pulse 98   Ht 5\' 3"  (1.6 m)   Wt 165 lb 8 oz (75.1 kg)   LMP 02/20/2021 (Exact Date)  BMI 29.32 kg/m  Waist 36.5 CONSTITUTIONAL: Well-developed, well-nourished female in no acute distress.  HENT:  Normocephalic, atraumatic, External right and left ear normal. Oropharynx is clear and moist EYES: Conjunctivae and EOM are normal. Pupils are equal, round, and reactive to light. No scleral icterus.  NECK: Normal range of motion, supple, no masses.  Normal thyroid.  SKIN: Skin is warm and dry. No rash noted. Not diaphoretic. No erythema. No pallor. MUSCULOSKELETAL: Normal range of motion. No tenderness.  No cyanosis, clubbing, or edema.  2+ distal pulses. NEUROLOGIC: Alert and oriented to person, place, and time. Normal  reflexes, muscle tone coordination.  PSYCHIATRIC: Normal mood and affect. Normal behavior. Normal judgment and thought content. CARDIOVASCULAR: Normal heart rate noted, regular rhythm RESPIRATORY: Clear to auscultation bilaterally. Effort and breath sounds normal, no problems with respiration noted. BREASTS: Symmetric in size. No masses, tenderness, skin changes, nipple drainage, or lymphadenopathy bilaterally.  ABDOMEN: Soft, no distention noted.  No tenderness, rebound or guarding.  PELVIC: Normal appearing external genitalia and urethral meatus; normal appearing vaginal mucosa and cervix.  No abnormal discharge noted.  Pap smear obtained.  Normal uterine size, no other palpable masses, no uterine or adnexal tenderness.  .   Assessment and Plan:    Well Women GYN exam    Encounter for weight management  - cyanocobalamin ((VITAMIN B-12)) injection 1,000 mcg   Pap: n/a  Mammogram : n/a  Labs: none Refills: phentermine, OCP Celexa Referral: neurologist for migraines  Routine preventative health maintenance measures emphasized. Please refer to After Visit Summary for other counseling recommendations.      Doreene Burke, CNM [facility],  [facility]

## 2021-03-28 ENCOUNTER — Other Ambulatory Visit: Payer: Self-pay

## 2021-03-28 ENCOUNTER — Ambulatory Visit (INDEPENDENT_AMBULATORY_CARE_PROVIDER_SITE_OTHER): Admitting: Certified Nurse Midwife

## 2021-03-28 ENCOUNTER — Encounter: Payer: Self-pay | Admitting: Certified Nurse Midwife

## 2021-03-28 VITALS — BP 125/76 | HR 114 | Ht 63.0 in | Wt 161.5 lb

## 2021-03-28 DIAGNOSIS — Z7689 Persons encountering health services in other specified circumstances: Secondary | ICD-10-CM

## 2021-03-28 MED ORDER — PHENTERMINE HCL 37.5 MG PO CAPS: 37.5000 mg | ORAL_CAPSULE | ORAL | 0 refills | Status: DC

## 2021-03-28 MED ORDER — CYANOCOBALAMIN 1000 MCG/ML IJ SOLN
1000.0000 ug | Freq: Once | INTRAMUSCULAR | Status: AC
  Administered 2021-03-28: 1000 ug via INTRAMUSCULAR

## 2021-03-28 NOTE — Patient Instructions (Signed)
Exercising to Lose Weight Exercise is structured, repetitive physical activity to improve fitness and health. Getting regular exercise is important for everyone. It is especially important if you are overweight. Being overweight increases your risk of heart disease, stroke, diabetes, high blood pressure, and several types of cancer.Reducing your calorie intake and exercising can help you lose weight. Exercise is usually categorized as moderate or vigorous intensity. To lose weight, most people need to do a certain amount of moderate-intensity orvigorous-intensity exercise each week. Moderate-intensity exercise  Moderate-intensity exercise is any activity that gets you moving enough to burn at least three times more energy (calories) than if you were sitting. Examples of moderate exercise include: Walking a mile in 15 minutes. Doing light yard work. Biking at an easy pace. Most people should get at least 150 minutes (2 hours and 30 minutes) a week ofmoderate-intensity exercise to maintain their body weight. Vigorous-intensity exercise Vigorous-intensity exercise is any activity that gets you moving enough to burn at least six times more calories than if you were sitting. When you exercise at this intensity, you should be working hard enough that you are not able tocarry on a conversation. Examples of vigorous exercise include: Running. Playing a team sport, such as football, basketball, and soccer. Jumping rope. Most people should get at least 75 minutes (1 hour and 15 minutes) a week ofvigorous-intensity exercise to maintain their body weight. How can exercise affect me? When you exercise enough to burn more calories than you eat, you lose weight. Exercise also reduces body fat and builds muscle. The more muscle you have, the more calories you burn. Exercise also: Improves mood. Reduces stress and tension. Improves your overall fitness, flexibility, and endurance. Increases bone strength. The  amount of exercise you need to lose weight depends on: Your age. The type of exercise. Any health conditions you have. Your overall physical ability. Talk to your health care provider about how much exercise you need and whattypes of activities are safe for you. What actions can I take to lose weight? Nutrition  Make changes to your diet as told by your health care provider or diet and nutrition specialist (dietitian). This may include: Eating fewer calories. Eating more protein. Eating less unhealthy fats. Eating a diet that includes fresh fruits and vegetables, whole grains, low-fat dairy products, and lean protein. Avoiding foods with added fat, salt, and sugar. Drink plenty of water while you exercise to prevent dehydration or heat stroke.  Activity Choose an activity that you enjoy and set realistic goals. Your health care provider can help you make an exercise plan that works for you. Exercise at a moderate or vigorous intensity most days of the week. The intensity of exercise may vary from person to person. You can tell how intense a workout is for you by paying attention to your breathing and heartbeat. Most people will notice their breathing and heartbeat get faster with more intense exercise. Do resistance training twice each week, such as: Push-ups. Sit-ups. Lifting weights. Using resistance bands. Getting short amounts of exercise can be just as helpful as long structured periods of exercise. If you have trouble finding time to exercise, try to include exercise in your daily routine. Get up, stretch, and walk around every 30 minutes throughout the day. Go for a walk during your lunch break. Park your car farther away from your destination. If you take public transportation, get off one stop early and walk the rest of the way. Make phone calls while standing up and   walking around. Take the stairs instead of elevators or escalators. Wear comfortable clothes and shoes with  good support. Do not exercise so much that you hurt yourself, feel dizzy, or get very short of breath. Where to find more information [place] Department of Health and Human Services: www.hhs.gov Centers for Disease Control and Prevention (CDC): www.cdc.gov Contact a health care provider: Before starting a new exercise program. If you have questions or concerns about your weight. If you have a medical problem that keeps you from exercising. Get help right away if you have any of the following while exercising: Injury. Dizziness. Difficulty breathing or shortness of breath that does not go away when you stop exercising. Chest pain. Rapid heartbeat. Summary Being overweight increases your risk of heart disease, stroke, diabetes, high blood pressure, and several types of cancer. Losing weight happens when you burn more calories than you eat. Reducing the amount of calories you eat in addition to getting regular moderate or vigorous exercise each week helps you lose weight. This information is not intended to replace advice given to you by your health care provider. Make sure you discuss any questions you have with your healthcare provider. Document Revised: 10/26/2019 Document Reviewed: 11/12/2019 Elsevier Patient Education  2022 Elsevier Inc.  

## 2021-03-28 NOTE — Progress Notes (Signed)
SUBJECTIVE:  35 y.o. here for follow-up weight loss visit, previously seen 4 weeks ago. Denies any concerns and feels like medication is working well. She has lost 4 lbs this past month. She denies any negative side effects from the medications.   OBJECTIVE:  BP 125/76   Pulse (!) 114   Ht 5\' 3"  (1.6 m)   Wt 161 lb 8 oz (73.3 kg)   BMI 28.61 kg/m   Body mass index is 28.61 kg/m. Waist: 35.5  Patient appears well. ASSESSMENT:  Obesity- responding well to weight loss plan PLAN:  To continue with current medications. B12 1075mcg/ml injection given RTC in 4 weeks as planned  80m, CNM

## 2021-03-31 ENCOUNTER — Other Ambulatory Visit: Payer: Self-pay

## 2021-03-31 ENCOUNTER — Encounter: Payer: Self-pay | Admitting: Dietician

## 2021-03-31 ENCOUNTER — Encounter: Attending: Certified Nurse Midwife | Admitting: Dietician

## 2021-03-31 VITALS — Ht 63.0 in | Wt 162.0 lb

## 2021-03-31 DIAGNOSIS — Z6828 Body mass index (BMI) 28.0-28.9, adult: Secondary | ICD-10-CM | POA: Diagnosis not present

## 2021-03-31 DIAGNOSIS — Z6830 Body mass index (BMI) 30.0-30.9, adult: Secondary | ICD-10-CM

## 2021-03-31 DIAGNOSIS — E669 Obesity, unspecified: Secondary | ICD-10-CM | POA: Diagnosis present

## 2021-03-31 NOTE — Patient Instructions (Signed)
Measure food portions to increase awareness of intake and get closer to ideal amounts. Especially control high calorie meat portions, peanut butter. Continue with mostly healthy food choices, great job! Make mostly low fat choices, including cheeses (try 2% or light or thin slices), meats.

## 2021-03-31 NOTE — Progress Notes (Signed)
Medical Nutrition Therapy: Visit start time: 1010  end time: 1110  Assessment:  Diagnosis: overweight Past medical history: none significant Psychosocial issues/ stress concerns: anxiety, takes medication to treat  Preferred learning method:  Auditory Visual Hands-on   Current weight: 162.0lbs Height: 5'3" BMI: 28.7 Medications, supplements: reconciled list in medical record  Progress and evaluation:  Patient reports about 10lb weight loss since 01/2021, when she started phentermine. Her personal goal is 140lbs, and healthier eating pattern; was last at this weight prior to 3rd pregnancy in 2019/ 2020. Has gained since pandemic onset.  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 Dining out frequency: 2-3 meals per week.  Breakfast: 2-3 scrambled eggs, occ with small portion grits Snack: apple/ cucumber/ pork rinds; sf beef jerky; cheese stick Lunch: tuna or chicken salad wrap Snack: same as am, or graham crackers; popcorn with butter (difficult to manage); keeps strawberries, blueberries, bananas, melon in summer Supper: meat + veg (low starch in general), occ potato; sometimes cereal; frozen pizza Snack: none or same as pm Beverages: mostly water; occ glass of milk  Nutrition Care Education: Topics covered:  Basic nutrition: basic food groups, appropriate nutrient balance, appropriate meal and snack schedule, general nutrition guidelines, benefits of whole foods vs processed Weight control: determining reasonable weight loss rate;  importance of low sugar and low fat choices; portion control strategies including measuring portions; estimated energy needs for weight loss at 1400kcal, provided guidance for 40% CHO, 30% protein, and 30% fat, and discussed healthy ranges for carb and protein intake; discussed mindless vs mindful eating   Nutritional Diagnosis:  [place]-3.3 Overweight/obesity As related to history of excess calories, possible effect of anxiety.  As evidenced by patient with current BMI of 28.7, following calorie restricted eating pattern to promote weight loss.  Intervention:  Instruction and discussion as noted above. Patient has been making healthy diet and lifestyle changes for weight loss and to promote healthy eating pattern for her family. She is motivated to continue. Established additional goals for change with direction from patient. No follow up scheduled at this time; patient to schedule later if needed.  Education Materials given:  Designer, industrial/product with food lists, sample meal pattern Sample menus Visit summary with goals/ instructions   Learner/ who was taught:  Patient    Level of understanding: Verbalizes/ demonstrates competency   Demonstrated degree of understanding via:   Teach back Learning barriers: None  Willingness to learn/ readiness for change: Eager, change in progress   Monitoring and Evaluation:  Dietary intake, exercise, and body weight      follow up: prn

## 2021-04-13 ENCOUNTER — Ambulatory Visit: Admitting: Neurology

## 2021-05-02 ENCOUNTER — Encounter: Payer: Self-pay | Admitting: Certified Nurse Midwife

## 2021-05-02 ENCOUNTER — Ambulatory Visit (INDEPENDENT_AMBULATORY_CARE_PROVIDER_SITE_OTHER): Admitting: Certified Nurse Midwife

## 2021-05-02 ENCOUNTER — Other Ambulatory Visit: Payer: Self-pay

## 2021-05-02 VITALS — BP 119/75 | HR 91 | Ht 63.0 in | Wt 159.5 lb

## 2021-05-02 DIAGNOSIS — Z7689 Persons encountering health services in other specified circumstances: Secondary | ICD-10-CM | POA: Diagnosis not present

## 2021-05-02 MED ORDER — CYANOCOBALAMIN 1000 MCG/ML IJ SOLN
1000.0000 ug | Freq: Once | INTRAMUSCULAR | Status: AC
  Administered 2021-05-02: 1000 ug via INTRAMUSCULAR

## 2021-05-02 MED ORDER — PHENTERMINE HCL 37.5 MG PO CAPS: 37.5000 mg | ORAL_CAPSULE | ORAL | 0 refills | Status: DC

## 2021-05-02 NOTE — Patient Instructions (Signed)

## 2021-05-02 NOTE — Progress Notes (Signed)
SUBJECTIVE:  35 y.o. here for follow-up weight loss visit, previously seen 4 weeks ago. Denies any concerns and feels like medication is working. She denies  any negative side effects. She lost 2 lbs this past month.   OBJECTIVE:  BP 119/75   Pulse 91   Ht 5\' 3"  (1.6 m)   Wt 159 lb 8 oz (72.3 kg)   BMI 28.25 kg/m   Body mass index is 28.25 kg/m. Waist:34.25  Patient appears well. ASSESSMENT:  Obesity- responding well to weight loss plan PLAN:  To continue with current medications. B12 1026mcg/ml injection given RTC in 4 weeks as planned  80m, CNM

## 2021-05-18 NOTE — Progress Notes (Signed)
NEUROLOGY CONSULTATION NOTE  [name] MRN: [id number] DOB: 1986-03-23  Referring provider: Doreene Burke, CNM Primary care provider: Galen Manila, NP  Reason for consult:  migraines  Assessment/Plan:   Migraine without aura, without status migrainosus, not intractable - significantly improved with diet  Rizatriptan 10mg  for rescue Continue diet, hydration, exercise Follow up 6 months.   Subjective:  Katherine Hansen is a 35 year old female who presents for migraines.  History supplemented by referring provider's note.  Onset:  middle school Location:  starts behind either eye and travels back to occiput Quality:  pressure - shooting pain backwards Intensity:  7-8/10 Aura:  absent Prodrome:  absent Associated symptoms:  Nausea, photophobia, phonophobia, osmophobia, blurred/tunnel vision, rarely vomiting.  She denies associated unilateral numbness or weakness. Duration:  usually 1.5 days  Frequency:  1 to 2 days a month (down from 3-4 since changing diet and hydration) Frequency of abortive medication: 2 days a month Triggers:  crick in neck, stress Relieving factors:  sleep Activity:  aggravates  Current NSAIDS/analgesics:  ibuprofen Current triptans:  sumatriptan tab Current ergotamine:  none Current anti-emetic:  none Current muscle relaxants:  none Current Antihypertensive medications:  none Current Antidepressant medications:  citalopram 20mg  daily Current Anticonvulsant medications:  none Current anti-CGRP:  none Current Vitamins/Herbal/Supplements:  B12 Current Antihistamines/Decongestants:  none Other therapy:  none Hormone/birth control:  Blisovi Fe   Past NSAIDS/analgesics:  Tylenol Past abortive triptans:  none Past abortive ergotamine:  none Past muscle relaxants:  none Past anti-emetic:  none Past antihypertensive medications:  none Past antidepressant medications:  none Past anticonvulsant medications:  none Past anti-CGRP:   none Past vitamins/Herbal/Supplements:  magnesium oxide 400mg  Past antihistamines/decongestants:  Flonase, loratadine Other past therapies:  none  Caffeine:  1 cup of coffee daily (sometimes 2).  No soda Diet:  Increased water intake.  Stopped soda.  Increase protein and less sugar Exercise:  15-20 minutes cardio daily Depression:  no; Anxiety:  sometimes stress Other pain:  no Sleep:  5 hours (toddler is a poor sleeper) Family history of headache:  mom, grandmother, aunts      PAST MEDICAL HISTORY: Past Medical History:  Diagnosis Date   Labor and delivery, indication for care 07/08/2017   Medical history non-contributory    Pilonidal cyst     PAST SURGICAL HISTORY: Past Surgical History:  Procedure Laterality Date   NO PAST SURGERIES     none      MEDICATIONS: Current Outpatient Medications on File Prior to Visit  Medication Sig Dispense Refill   citalopram (CELEXA) 20 MG tablet Take 1 tablet (20 mg total) by mouth daily. 90 tablet 3   cyanocobalamin (,VITAMIN B-12,) 1000 MCG/ML injection cyanocobalamin (vit B-12) 1,000 mcg/mL injection solution     ibuprofen (ADVIL) 200 MG tablet Take 200 mg by mouth every 6 (six) hours as needed.     norethindrone-ethinyl estradiol-FE (BLISOVI FE 1/20) 1-20 MG-MCG tablet Take 1 tablet by mouth daily. 28 tablet 0   phentermine 37.5 MG capsule Take 1 capsule (37.5 mg total) by mouth every morning. 30 capsule 0   No current facility-administered medications on file prior to visit.    ALLERGIES: Allergies  Allergen Reactions   Septra [Sulfamethoxazole-Trimethoprim]    Tetracycline Swelling    FAMILY HISTORY: Family History  Problem Relation Age of Onset   Migraines Mother    Rheum arthritis Maternal Grandmother    Migraines Maternal Grandmother    Depression Maternal Grandmother    Rashes /  Skin problems Daughter        Ezcema   Depression Maternal Aunt    Alcohol abuse Father     Objective:  Blood pressure 129/79,  pulse (!) 101, resp. rate 18, height 5\' 3"  (1.6 m), weight 159 lb (72.1 kg), SpO2 97 %, currently breastfeeding. General: No acute distress.  Patient appears well-groomed.   Head:  Normocephalic/atraumatic Eyes:  fundi examined but not visualized Neck: supple, no paraspinal tenderness, full range of motion Back: No paraspinal tenderness Heart: regular rate and rhythm Lungs: Clear to auscultation bilaterally. Vascular: No carotid bruits. Neurological Exam: Mental status: alert and oriented to person, place, and time, recent and remote memory intact, fund of knowledge intact, attention and concentration intact, speech fluent and not dysarthric, language intact. Cranial nerves: CN I: not tested CN II: pupils equal, round and reactive to light, visual fields intact CN III, IV, VI:  full range of motion, no nystagmus, no ptosis CN V: facial sensation intact. CN VII: upper and lower face symmetric CN VIII: hearing intact CN IX, X: gag intact, uvula midline CN XI: sternocleidomastoid and trapezius muscles intact CN XII: tongue midline Bulk & Tone: normal, no fasciculations. Motor:  muscle strength 5/5 throughout Sensation:  Pinprick, temperature and vibratory sensation intact. Deep Tendon Reflexes:  2+ throughout,  toes downgoing.   Finger to nose testing:  Without dysmetria.   Heel to shin:  Without dysmetria.   Gait:  Normal station and stride.  Romberg negative.    Thank you for allowing me to take part in the care of this patient.  , DO  CC:  Shon Millet, NP  Galen Manila, CNM

## 2021-05-19 ENCOUNTER — Ambulatory Visit (INDEPENDENT_AMBULATORY_CARE_PROVIDER_SITE_OTHER): Admitting: Neurology

## 2021-05-19 ENCOUNTER — Encounter: Payer: Self-pay | Admitting: Neurology

## 2021-05-19 ENCOUNTER — Other Ambulatory Visit: Payer: Self-pay

## 2021-05-19 VITALS — BP 129/79 | HR 101 | Resp 18 | Ht 63.0 in | Wt 159.0 lb

## 2021-05-19 DIAGNOSIS — G43009 Migraine without aura, not intractable, without status migrainosus: Secondary | ICD-10-CM

## 2021-05-19 MED ORDER — RIZATRIPTAN BENZOATE 10 MG PO TBDP: ORAL_TABLET | ORAL | 5 refills | Status: DC

## 2021-05-19 NOTE — Patient Instructions (Signed)
  Take rizatriptan 10mg at earliest onset of headache.  May repeat dose once in 2 hours if needed.  Maximum 2 tablets in 24 hours. Limit use of pain relievers to no more than 2 days out of the week.  These medications include acetaminophen, NSAIDs (ibuprofen/Advil/Motrin, naproxen/Aleve, triptans (Imitrex/sumatriptan), Excedrin, and narcotics.  This will help reduce risk of rebound headaches. Be aware of common food triggers:  - Caffeine:  coffee, black tea, cola, Mt. Dew  - Chocolate  - Dairy:  aged cheeses (brie, blue, cheddar, gouda, Parmasan, provolone, romano, Swiss, etc), chocolate milk, buttermilk, sour cream, limit eggs and yogurt  - Nuts, peanut butter  - Alcohol  - Cereals/grains:  FRESH breads (fresh bagels, sourdough, doughnuts), yeast productions  - Processed/canned/aged/cured meats (pre-packaged deli meats, hotdogs)  - MSG/glutamate:  soy sauce, flavor enhancer, pickled/preserved/marinated foods  - Sweeteners:  aspartame (Equal, Nutrasweet).  Sugar and Splenda are okay  - Vegetables:  legumes (lima beans, lentils, snow peas, fava beans, pinto peans, peas, garbanzo beans), sauerkraut, onions, olives, pickles  - Fruit:  avocados, bananas, citrus fruit (orange, lemon, grapefruit), mango  - Other:  Frozen meals, macaroni and cheese Routine exercise Stay adequately hydrated (aim for 64 oz water daily) Keep headache diary Maintain proper stress management Maintain proper sleep hygiene Do not skip meals Consider supplements:  magnesium citrate 400mg daily, riboflavin 400mg daily, coenzyme Q10 100mg three times daily.  

## 2021-05-30 ENCOUNTER — Ambulatory Visit (INDEPENDENT_AMBULATORY_CARE_PROVIDER_SITE_OTHER): Admitting: Certified Nurse Midwife

## 2021-05-30 ENCOUNTER — Encounter: Payer: Self-pay | Admitting: Certified Nurse Midwife

## 2021-05-30 ENCOUNTER — Other Ambulatory Visit: Payer: Self-pay

## 2021-05-30 VITALS — BP 133/77 | HR 99 | Ht 63.0 in | Wt 158.4 lb

## 2021-05-30 DIAGNOSIS — Z7689 Persons encountering health services in other specified circumstances: Secondary | ICD-10-CM | POA: Diagnosis not present

## 2021-05-30 MED ORDER — PHENTERMINE HCL 37.5 MG PO CAPS: 37.5000 mg | ORAL_CAPSULE | ORAL | 0 refills | Status: DC

## 2021-05-30 MED ORDER — CYANOCOBALAMIN 1000 MCG/ML IJ SOLN
1000.0000 ug | Freq: Once | INTRAMUSCULAR | Status: AC
  Administered 2021-05-30: 1000 ug via INTRAMUSCULAR

## 2021-05-30 NOTE — Patient Instructions (Signed)

## 2021-05-30 NOTE — Progress Notes (Signed)
SUBJECTIVE:  35 y.o. here for follow-up weight loss visit, previously seen 4 weeks ago. Denies any concerns and feels like medication is working for her. She lost one pound this past month. Discussed changing up exercise routine, adding some time 2 x wk , including strength training.   OBJECTIVE:  BP 133/77   Pulse 99   Ht 5\' 3"  (1.6 m)   Wt 158 lb 6.4 oz (71.8 kg)   BMI 28.06 kg/m   Body mass index is 28.06 kg/m. Waist:34.75 in  Patient appears well. ASSESSMENT:  Obesity- responding well to weight loss plan PLAN:  To continue with current medications. B12 1052mcg/ml injection given RTC in 4 weeks as planned  80m, CNM

## 2021-06-10 ENCOUNTER — Other Ambulatory Visit: Payer: Self-pay | Admitting: Certified Nurse Midwife

## 2021-06-27 ENCOUNTER — Other Ambulatory Visit: Payer: Self-pay

## 2021-06-27 ENCOUNTER — Encounter: Payer: Self-pay | Admitting: Certified Nurse Midwife

## 2021-06-27 ENCOUNTER — Ambulatory Visit (INDEPENDENT_AMBULATORY_CARE_PROVIDER_SITE_OTHER): Admitting: Certified Nurse Midwife

## 2021-06-27 VITALS — BP 119/78 | HR 98 | Ht 63.0 in | Wt 159.3 lb

## 2021-06-27 DIAGNOSIS — Z7689 Persons encountering health services in other specified circumstances: Secondary | ICD-10-CM

## 2021-06-27 MED ORDER — CYANOCOBALAMIN 1000 MCG/ML IJ SOLN: 1000.0000 ug | Freq: Once | INTRAMUSCULAR | Status: AC

## 2021-06-27 MED ORDER — PHENTERMINE HCL 37.5 MG PO CAPS: 37.5000 mg | ORAL_CAPSULE | ORAL | 0 refills | Status: DC

## 2021-06-27 NOTE — Patient Instructions (Signed)

## 2021-06-27 NOTE — Progress Notes (Signed)
SUBJECTIVE:  35 y.o. here for follow-up weight loss visit, previously seen 4 weeks ago. Denies any concerns and feels like medication is working. She denies any negative side effect with use. She gained 1 lb this past month and contributes it to the holiday. Discussed adding additional work out time to off set eating at holiday.   OBJECTIVE:  BP 119/78   Pulse 98   Ht 5\' 3"  (1.6 m)   Wt 159 lb 4.8 oz (72.3 kg)   LMP 06/13/2021 (Approximate)   Breastfeeding Unknown   BMI 28.22 kg/m   Body mass index is 28.22 kg/m. Waist 35.5  Patient appears well. ASSESSMENT:  Obesity- responding well to weight loss plan PLAN:  To continue with current medications. B12 1041mcg/ml injection given RTC in 4 weeks as planned  80m, CNM

## 2021-07-15 ENCOUNTER — Other Ambulatory Visit: Payer: Self-pay | Admitting: Certified Nurse Midwife

## 2021-07-25 ENCOUNTER — Other Ambulatory Visit: Payer: Self-pay

## 2021-07-25 ENCOUNTER — Encounter: Payer: Self-pay | Admitting: Certified Nurse Midwife

## 2021-07-25 ENCOUNTER — Ambulatory Visit (INDEPENDENT_AMBULATORY_CARE_PROVIDER_SITE_OTHER): Admitting: Certified Nurse Midwife

## 2021-07-25 VITALS — BP 124/79 | HR 92 | Ht 63.0 in | Wt 158.3 lb

## 2021-07-25 DIAGNOSIS — Z7689 Persons encountering health services in other specified circumstances: Secondary | ICD-10-CM | POA: Diagnosis not present

## 2021-07-25 MED ORDER — PHENTERMINE HCL 37.5 MG PO CAPS
37.5000 mg | ORAL_CAPSULE | ORAL | 0 refills | Status: DC
Start: 2021-07-25 — End: 2021-08-22

## 2021-07-25 MED ORDER — CYANOCOBALAMIN 1000 MCG/ML IJ SOLN
1000.0000 ug | Freq: Once | INTRAMUSCULAR | Status: AC
  Administered 2021-07-25: 10:00:00 1000 ug via INTRAMUSCULAR

## 2021-07-25 NOTE — Patient Instructions (Signed)

## 2021-07-25 NOTE — Progress Notes (Signed)
SUBJECTIVE:  35 y.o. here for follow-up weight loss visit, previously seen 4 weeks ago. Denies any concerns and feels like medication is working well. She lost 1 lbs this past month.   OBJECTIVE:  BP 124/79    Pulse 92    Ht 5\' 3"  (1.6 m)    Wt 158 lb 4.8 oz (71.8 kg)    LMP  (LMP Unknown)    BMI 28.04 kg/m   Body mass index is 28.04 kg/m.  Waist: 34.5 in  Patient appears well. ASSESSMENT:  Obesity- responding well to weight loss plan PLAN:  To continue with current medications. B12 1062mcg/ml injection given RTC in 4 weeks as planned  80m, CNM

## 2021-08-11 ENCOUNTER — Other Ambulatory Visit: Payer: Self-pay | Admitting: Certified Nurse Midwife

## 2021-08-22 ENCOUNTER — Other Ambulatory Visit: Payer: Self-pay

## 2021-08-22 ENCOUNTER — Ambulatory Visit (INDEPENDENT_AMBULATORY_CARE_PROVIDER_SITE_OTHER): Admitting: Certified Nurse Midwife

## 2021-08-22 ENCOUNTER — Encounter: Payer: Self-pay | Admitting: Certified Nurse Midwife

## 2021-08-22 DIAGNOSIS — Z7689 Persons encountering health services in other specified circumstances: Secondary | ICD-10-CM

## 2021-08-22 MED ORDER — CYANOCOBALAMIN 1000 MCG/ML IJ SOLN
1000.0000 ug | Freq: Once | INTRAMUSCULAR | Status: AC
  Administered 2021-08-22: 10:00:00 1000 ug via INTRAMUSCULAR

## 2021-08-22 MED ORDER — PHENTERMINE HCL 37.5 MG PO CAPS: 37.5000 mg | ORAL_CAPSULE | ORAL | 0 refills | Status: DC

## 2021-08-22 NOTE — Patient Instructions (Signed)

## 2021-08-22 NOTE — Progress Notes (Signed)
SUBJECTIVE:  36 y.o. here for follow-up weight loss visit, previously seen 4 weeks ago. Denies any concerns and feels like medication is working, She denies any negative side effects with use. She lost 2 lbs this past month.   OBJECTIVE:  BP 123/82    Pulse (!) 109    Ht 5\' 3"  (1.6 m)    Wt 156 lb 11.2 oz (71.1 kg)    LMP  (LMP Unknown)    Breastfeeding Unknown    BMI 27.76 kg/m   Body mass index is 27.76 kg/m. Waist: 34  Patient appears well. ASSESSMENT:  Obesity- responding well to weight loss plan PLAN:  To continue with current medications. B12 1025mcg/ml injection given RTC in 4 weeks as planned  80m, CNM

## 2021-09-19 ENCOUNTER — Ambulatory Visit (INDEPENDENT_AMBULATORY_CARE_PROVIDER_SITE_OTHER): Admitting: Certified Nurse Midwife

## 2021-09-19 ENCOUNTER — Other Ambulatory Visit: Payer: Self-pay

## 2021-09-19 ENCOUNTER — Encounter: Payer: Self-pay | Admitting: Certified Nurse Midwife

## 2021-09-19 VITALS — BP 117/82 | HR 96 | Ht 63.0 in | Wt 154.8 lb

## 2021-09-19 DIAGNOSIS — Z7689 Persons encountering health services in other specified circumstances: Secondary | ICD-10-CM | POA: Diagnosis not present

## 2021-09-19 MED ORDER — CYANOCOBALAMIN 1000 MCG/ML IJ SOLN: INTRAMUSCULAR | 3 refills | Status: DC

## 2021-09-19 MED ORDER — CYANOCOBALAMIN 1000 MCG/ML IJ SOLN
1000.0000 ug | Freq: Once | INTRAMUSCULAR | Status: AC
  Administered 2021-09-19: 1000 ug via INTRAMUSCULAR

## 2021-09-19 MED ORDER — PHENTERMINE HCL 37.5 MG PO CAPS: 37.5000 mg | ORAL_CAPSULE | ORAL | 0 refills | Status: DC

## 2021-09-19 NOTE — Patient Instructions (Signed)

## 2021-09-19 NOTE — Progress Notes (Signed)
SUBJECTIVE:  36 y.o. here for follow-up weight loss visit, previously seen 4 weeks ago. Denies any concerns and feels like medication is working well. She denies any negative side effects with medication use. She lost 2 lbs this past month.   OBJECTIVE:  BP 117/82    Pulse 96    Ht 5\' 3"  (1.6 m)    Wt 154 lb 12.8 oz (70.2 kg)    LMP  (LMP Unknown)    BMI 27.42 kg/m   Body mass index is 27.42 kg/m. Waist: 32.75  Patient appears well. ASSESSMENT:  Obesity- responding well to weight loss plan PLAN:  To continue with current medications. B12 1065mcg/ml injection given RTC in 4 weeks as planned  80m, CNM

## 2021-10-01 ENCOUNTER — Other Ambulatory Visit: Payer: Self-pay | Admitting: Certified Nurse Midwife

## 2021-10-02 ENCOUNTER — Other Ambulatory Visit: Payer: Self-pay | Admitting: Certified Nurse Midwife

## 2021-10-03 ENCOUNTER — Other Ambulatory Visit: Payer: Self-pay | Admitting: Certified Nurse Midwife

## 2021-10-17 ENCOUNTER — Other Ambulatory Visit: Payer: Self-pay

## 2021-10-17 ENCOUNTER — Encounter: Payer: Self-pay | Admitting: Certified Nurse Midwife

## 2021-10-17 ENCOUNTER — Ambulatory Visit (INDEPENDENT_AMBULATORY_CARE_PROVIDER_SITE_OTHER): Admitting: Certified Nurse Midwife

## 2021-10-17 VITALS — BP 117/75 | HR 101 | Ht 63.0 in | Wt 153.8 lb

## 2021-10-17 DIAGNOSIS — Z713 Dietary counseling and surveillance: Secondary | ICD-10-CM | POA: Diagnosis not present

## 2021-10-17 MED ORDER — CYANOCOBALAMIN 1000 MCG/ML IJ SOLN
1000.0000 ug | Freq: Once | INTRAMUSCULAR | Status: AC
  Administered 2021-10-17: 1000 ug via INTRAMUSCULAR

## 2021-10-17 MED ORDER — PHENTERMINE HCL 37.5 MG PO CAPS: 37.5000 mg | ORAL_CAPSULE | ORAL | 0 refills | Status: DC

## 2021-10-17 NOTE — Patient Instructions (Signed)

## 2021-10-17 NOTE — Progress Notes (Signed)
SUBJECTIVE:  36 y.o. here for follow-up weight loss visit, previously seen 4 weeks ago. Denies any concerns and feels like medication is working. She lost 0.2 lbs this past month. She denies and issue with medication. Encouraged pt to add time 10 min to her workout, increase intensity, and adjust calorie intake based on current weight.  ? ?OBJECTIVE:  BP 117/75   Pulse (!) 101   Ht 5\' 3"  (1.6 m)   Wt 153 lb 12.8 oz (69.8 kg)   LMP  (LMP Unknown)   BMI 27.24 kg/m?   Body mass index is 27.24 kg/m?  Waist: 32.75 in ? ?Patient appears well. ?ASSESSMENT:  Obesity- responding well to weight loss plan ?PLAN:  To continue with current medications. B12 1027mcg/ml injection given ?RTC in 4 weeks as planned ? ?80m, CNM  ?

## 2021-10-28 ENCOUNTER — Other Ambulatory Visit: Payer: Self-pay | Admitting: Certified Nurse Midwife

## 2021-10-30 ENCOUNTER — Other Ambulatory Visit: Payer: Self-pay | Admitting: Certified Nurse Midwife

## 2021-11-06 ENCOUNTER — Other Ambulatory Visit: Payer: Self-pay | Admitting: Certified Nurse Midwife

## 2021-11-06 MED ORDER — PHENTERMINE HCL 37.5 MG PO CAPS: 37.5000 mg | ORAL_CAPSULE | ORAL | 0 refills | Status: DC

## 2021-11-14 ENCOUNTER — Ambulatory Visit (INDEPENDENT_AMBULATORY_CARE_PROVIDER_SITE_OTHER): Admitting: Certified Nurse Midwife

## 2021-11-14 VITALS — BP 117/79 | HR 96 | Ht 63.0 in | Wt 154.6 lb

## 2021-11-14 DIAGNOSIS — Z7689 Persons encountering health services in other specified circumstances: Secondary | ICD-10-CM

## 2021-11-14 MED ORDER — CYANOCOBALAMIN 1000 MCG/ML IJ SOLN
1000.0000 ug | Freq: Once | INTRAMUSCULAR | Status: AC
  Administered 2021-11-14: 1000 ug via INTRAMUSCULAR

## 2021-11-14 NOTE — Progress Notes (Signed)
SUBJECTIVE:  36 y.o. here for follow-up weight loss visit, previously seen 4 weeks ago. Denies any concerns and feels like medication is working. She gained 1 lb but has been doing more weight lifting. Reassurance given.Discussed that she will be 1 yrin July and that we will need to stop medication at that time. She verbalizes understanding and agrees to plan.  ? ?Refill was placed on medication last week, no order placed today.  ? ?OBJECTIVE:  BP 117/79   Pulse 96   Ht 5\' 3"  (1.6 m)   Wt 154 lb 9.6 oz (70.1 kg)   LMP  (LMP Unknown)   Breastfeeding No   BMI 27.39 kg/m?   Body mass index is 27.39 kg/m? Patient appears well. ?ASSESSMENT:  Obesity- responding well to weight loss plan ?PLAN:  To continue with current medications. B12 1049mcg/ml injection given ?RTC in 4 weeks as planned ? ?80m,  CNM  ?

## 2021-12-05 NOTE — Progress Notes (Signed)
? ?NEUROLOGY FOLLOW UP OFFICE NOTE ? ?Katherine Hansen ?[id number] ? ?Assessment/Plan:  ? ?Migraine without aura, without status migrainosus, not intractable - there is a cervicogenic component that doesn't always respond to rescue therapy. ?  ?Will prescribe tizanidine 4mg  to take at onset of neck pain and rizatriptan at onset of headache ?Refer to Dr. of Sports Medicine for OMT ?Follow up 4-5 months. ?  ?  ?Subjective:  ?Katherine Hansen is a 36 year old female who follows up for migraines. ? ?UPDATE: ?Intensity:  Severe ?Duration:  2-3 days.  Rizatriptan knocks it down but still has significant posterior neck/occipital pain.   ?Frequency:  at least twice a month (6 days total a month) ? ?Outside of migraines, has tension and decreased range of motion on left side of her neck.  Difficulty getting comfortable in bed.  Bought many different pillows.   ? ?Current NSAIDS/analgesics:  ibuprofen ?Current triptans:  rizatriptan ?Current ergotamine:  none ?Current anti-emetic:  none ?Current muscle relaxants:  none ?Current Antihypertensive medications:  none ?Current Antidepressant medications:  citalopram 20mg  daily ?Current Anticonvulsant medications:  none ?Current anti-CGRP:  none ?Current Vitamins/Herbal/Supplements:  B12 ?Current Antihistamines/Decongestants:  none ?Other therapy:  none ?Hormone/birth control:  Blisovi Fe ? ?Caffeine:  1 cup of coffee daily (sometimes 2).  No soda ?Diet:  Increased water intake.  Stopped soda.  Increase protein and less sugar ?Exercise:  15-20 minutes cardio daily ?Depression:  no; Anxiety:  sometimes stress ?Other pain:  no ?Sleep:  5 hours (toddler is a poor sleeper) ? ?HISTORY:  ?Onset:  middle school ?Location:  starts behind either eye and travels back to occiput ?Quality:  pressure - shooting pain backwards ?Initial Intensity:  7-8/10 ?Aura:  absent ?Prodrome:  absent ?Associated symptoms:  Nausea, photophobia, phonophobia, osmophobia, blurred/tunnel vision,  rarely vomiting.  She denies associated unilateral numbness or weakness. ?Initial Duration:  usually 1.5 days  ?Initial Frequency:  1 to 2 days a month (down from 3-4 since changing diet and hydration) ?Frequency of abortive medication: 2 days a month ?Triggers:  crick in neck, stress ?Relieving factors:  sleep ?Activity:  aggravates ?  ? ?Past NSAIDS/analgesics:  Tylenol ?Past abortive triptans:  sumatriptan ?Past abortive ergotamine:  none ?Past muscle relaxants:  none ?Past anti-emetic:  none ?Past antihypertensive medications:  none ?Past antidepressant medications:  none ?Past anticonvulsant medications:  none ?Past anti-CGRP:  none ?Past vitamins/Herbal/Supplements:  magnesium oxide 400mg  ?Past antihistamines/decongestants:  Flonase, loratadine ?Other past therapies:  none ?  ? ?Family history of headache:  mom, grandmother, aunts ? ?PAST MEDICAL HISTORY: ?Past Medical History:  ?Diagnosis Date  ? Labor and delivery, indication for care 07/08/2017  ? Medical history non-contributory   ? Pilonidal cyst   ? ? ?MEDICATIONS: ?Current Outpatient Medications on File Prior to Visit  ?Medication Sig Dispense Refill  ? citalopram (CELEXA) 20 MG tablet Take 1 tablet (20 mg total) by mouth daily. 90 tablet 3  ? cyanocobalamin (,VITAMIN B-12,) 1000 MCG/ML injection cyanocobalamin (vit B-12) 1,000 mcg/mL injection solution 1 mL 3  ? phentermine 37.5 MG capsule Take 1 capsule (37.5 mg total) by mouth every morning. 30 capsule 0  ? rizatriptan (MAXALT-MLT) 10 MG disintegrating tablet Take 1 tablet earliest onset of migraine.  May repeat in 2 hours if needed. Maximum 2 tablets in 24 hours. 10 tablet 5  ? ?Current Facility-Administered Medications on File Prior to Visit  ?Medication Dose Route Frequency Provider Last Rate Last Admin  ? cyanocobalamin ((VITAMIN B-12)) injection  1,000 mcg  1,000 mcg Intramuscular Once Doreene Burke, CNM      ? ? ?ALLERGIES: ?Allergies  ?Allergen Reactions  ? Septra  [Sulfamethoxazole-Trimethoprim]   ? Tetracycline Swelling  ? ? ?FAMILY HISTORY: ?Family History  ?Problem Relation Age of Onset  ? Migraines Mother   ? Rheum arthritis Maternal Grandmother   ? Migraines Maternal Grandmother   ? Depression Maternal Grandmother   ? Rashes / Skin problems Daughter   ?     Ezcema  ? Depression Maternal Aunt   ? Alcohol abuse Father   ? ? ?  ?Objective:  ?Blood pressure 110/74, pulse 98, height 5\' 3"  (1.6 m), weight 154 lb 8 oz (70.1 kg), SpO2 (!) 72 %. ?General: No acute distress.  Patient appears well-groomed.   ?Head:  Normocephalic/atraumatic ?Eyes:  Fundi examined but not visualized ?Neck: supple, bilatral paraspinal tenderness, full range of motion ?Heart:  Regular rate and rhythm ?Neurological Exam: alert and oriented to person, place, and time.  Speech fluent and not dysarthric, language intact.  CN II-XII intact. Bulk and tone normal, muscle strength 5/5 throughout.  Sensation to light touch intact.  Deep tendon reflexes 2+ throughout, toes downgoing.  Finger to nose testing intact.  Gait normal, Romberg negative. ? ? ? , DO ? ?CC: Shon Millet, NP ? ? ? ? ? ? ?

## 2021-12-06 ENCOUNTER — Encounter: Payer: Self-pay | Admitting: Neurology

## 2021-12-06 ENCOUNTER — Ambulatory Visit (INDEPENDENT_AMBULATORY_CARE_PROVIDER_SITE_OTHER): Admitting: Neurology

## 2021-12-06 VITALS — BP 110/74 | HR 98 | Ht 63.0 in | Wt 154.5 lb

## 2021-12-06 DIAGNOSIS — M542 Cervicalgia: Secondary | ICD-10-CM

## 2021-12-06 DIAGNOSIS — G43009 Migraine without aura, not intractable, without status migrainosus: Secondary | ICD-10-CM

## 2021-12-06 MED ORDER — TIZANIDINE HCL 4 MG PO TABS: 4.0000 mg | ORAL_TABLET | Freq: Four times a day (QID) | ORAL | 0 refills | Status: DC | PRN

## 2021-12-06 NOTE — Patient Instructions (Signed)
When you start getting neck pain, take tizanidine 4mg  as needed.  Caution for drowsiness.  Take rizatriptan at earliest onset of migraine ?Refer to Dr. Hulan Saas at [facility] ?Follow up 4-5 months. ?

## 2021-12-13 ENCOUNTER — Ambulatory Visit (INDEPENDENT_AMBULATORY_CARE_PROVIDER_SITE_OTHER): Admitting: Certified Nurse Midwife

## 2021-12-13 ENCOUNTER — Encounter: Payer: Self-pay | Admitting: Certified Nurse Midwife

## 2021-12-13 VITALS — BP 108/69 | HR 105 | Ht 63.0 in | Wt 152.0 lb

## 2021-12-13 DIAGNOSIS — Z7689 Persons encountering health services in other specified circumstances: Secondary | ICD-10-CM

## 2021-12-13 MED ORDER — PHENTERMINE HCL 37.5 MG PO CAPS
37.5000 mg | ORAL_CAPSULE | ORAL | 0 refills | Status: DC
Start: 2021-12-13 — End: 2022-01-09

## 2021-12-13 MED ORDER — CYANOCOBALAMIN 1000 MCG/ML IJ SOLN
1000.0000 ug | Freq: Once | INTRAMUSCULAR | Status: AC
  Administered 2021-12-13: 1000 ug via INTRAMUSCULAR

## 2021-12-13 NOTE — Progress Notes (Signed)
SUBJECTIVE:  36 y.o. here for follow-up weight loss visit, previously seen 4 weeks ago. Denies any concerns and feels like medication is working she lost 2 lbs this past month. She will continue on program until July.  ? ?OBJECTIVE:  BP 108/69   Pulse (!) 105   Ht 5\' 3"  (1.6 m)   Wt 152 lb (68.9 kg)   LMP 12/04/2021 (Exact Date)   BMI 26.93 kg/m?   Body mass index is 26.93 kg/m?02/03/2022 Waist: ** ? ?Patient appears well. ?ASSESSMENT:  Obesity- responding well to weight loss plan ?PLAN:  To continue with current medications. B12 1022mcg/ml injection given ?RTC in 4 weeks as planned ? ?80m, CNM  ?

## 2021-12-15 ENCOUNTER — Encounter: Admitting: Certified Nurse Midwife

## 2022-01-09 ENCOUNTER — Encounter: Payer: Self-pay | Admitting: Certified Nurse Midwife

## 2022-01-09 ENCOUNTER — Ambulatory Visit (INDEPENDENT_AMBULATORY_CARE_PROVIDER_SITE_OTHER): Admitting: Certified Nurse Midwife

## 2022-01-09 VITALS — BP 106/73 | HR 92 | Ht 63.0 in | Wt 153.8 lb

## 2022-01-09 DIAGNOSIS — Z7689 Persons encountering health services in other specified circumstances: Secondary | ICD-10-CM

## 2022-01-09 MED ORDER — PHENTERMINE HCL 37.5 MG PO CAPS: 37.5000 mg | ORAL_CAPSULE | ORAL | 0 refills | Status: DC

## 2022-01-09 NOTE — Progress Notes (Signed)
SUBJECTIVE:  36 y.o. here for follow-up weight loss visit, previously seen 4 weeks ago. Denies any concerns and feels like medication is working well, Her husband was deployed and has not been home , she has been caring for her 4 children alone making it difficult to work out. Admits that her eating has been a bit declined as well due to the stress. Discussed with pt that she is [place] [facility] [place] July. That we will refill for this month . Then we will stop medications. She verbalizes and agrees to plan.    OBJECTIVE:  BP 106/73   Pulse 92   Ht 5\' 3"  (1.6 m)   Wt 153 lb 12.8 oz (69.8 kg)   LMP 12/04/2021 (Exact Date)   BMI 27.24 kg/m   Body mass index is 27.24 kg/m. Waist.35 in   Patient appears well. ASSESSMENT:  Obesity- responding well to weight loss plan PLAN:  To continue with current medications. B12 1021mcg/ml injection given RTC PRN  Philip Aspen, CNM

## 2022-01-09 NOTE — Patient Instructions (Signed)

## 2022-02-10 ENCOUNTER — Other Ambulatory Visit: Payer: Self-pay | Admitting: Certified Nurse Midwife

## 2022-04-10 ENCOUNTER — Encounter: Payer: Self-pay | Admitting: Certified Nurse Midwife

## 2022-05-14 NOTE — Progress Notes (Unsigned)
Virtual Visit via Video Note  Consent was obtained for video visit:  Yes.   Answered questions that patient had about telehealth interaction:  Yes.   I discussed the limitations, risks, security and privacy concerns of performing an evaluation and management service by telemedicine. I also discussed with the patient that there may be a patient responsible charge related to this service. The patient expressed understanding and agreed to proceed.  Pt location: Home Physician Location: office Name of referring provider:  No ref. provider found I connected with Nicolette Bang at patients initiation/request on 05/16/2022 at 10:50 AM EDT by video enabled telemedicine application and verified that I am speaking with the correct person using two identifiers. Pt MRN:  [id number] Pt DOB:  02-Jun-1986 Video Participants:  Nicolette Bang   Assessment/Plan:   Migraine without aura, without status migrainosus, not intractable - there is a cervicogenic component that doesn't always respond to rescue therapy.   Will prescribe tizanidine 4mg  to take at onset of neck pain and rizatriptan at onset of headache Refer to Dr. Hulan Saas of Sports Medicine for OMT Follow up 4-5 months.     Subjective:  Katherine Hansen is a 36 year old female who follows up for migraines.  UPDATE: Intensity:  Severe Duration:  2-3 days.  Rizatriptan knocks it down but still has significant posterior neck/occipital pain.   Frequency:  at least twice a month (6 days total a month  Referred to Sports Medicine.  ***  Rescue protocol:  tizanidine 4mg  for onset of neck pain, rizatriptan for onset of migraine.  Current NSAIDS/analgesics:  ibuprofen Current triptans:  rizatriptan Current ergotamine:  none Current anti-emetic:  none Current muscle relaxants:  tizanidine 4mg  PRN (onset of neck pain) Current Antihypertensive medications:  none Current Antidepressant medications:  citalopram 20mg  daily Current  Anticonvulsant medications:  none Current anti-CGRP:  none Current Vitamins/Herbal/Supplements:  B12 Current Antihistamines/Decongestants:  none Other therapy:  none Hormone/birth control:  Blisovi Fe  Caffeine:  1 cup of coffee daily (sometimes 2).  No soda Diet:  Increased water intake.  Stopped soda.  Increase protein and less sugar Exercise:  15-20 minutes cardio daily Depression:  no; Anxiety:  sometimes stress Other pain:  no Sleep:  5 hours (toddler is a poor sleeper)  HISTORY:  Onset:  middle school Location:  starts behind either eye and travels back to occiput Quality:  pressure - shooting pain backwards Initial Intensity:  7-8/10 Aura:  absent Prodrome:  absent Associated symptoms:  Nausea, photophobia, phonophobia, osmophobia, blurred/tunnel vision, rarely vomiting.  She denies associated unilateral numbness or weakness. Initial Duration:  usually 1.5 days  Initial Frequency:  1 to 2 days a month (down from 3-4 since changing diet and hydration) Frequency of abortive medication: 2 days a month Triggers:  crick in neck, stress Relieving factors:  sleep Activity:  aggravates    Past NSAIDS/analgesics:  Tylenol Past abortive triptans:  sumatriptan Past abortive ergotamine:  none Past muscle relaxants:  none Past anti-emetic:  none Past antihypertensive medications:  none Past antidepressant medications:  none Past anticonvulsant medications:  none Past anti-CGRP:  none Past vitamins/Herbal/Supplements:  magnesium oxide 400mg  Past antihistamines/decongestants:  Flonase, loratadine Other past therapies:  none    Family history of headache:  mom, grandmother, aunts  Past Medical History: Past Medical History:  Diagnosis Date   Labor and delivery, indication for care 07/08/2017   Medical history non-contributory    Pilonidal cyst     Medications: Outpatient Encounter  Medications as of 05/16/2022  Medication Sig   citalopram (CELEXA) 20 MG tablet TAKE 1  TABLET BY MOUTH DAILY   cyanocobalamin (,VITAMIN B-12,) 1000 MCG/ML injection cyanocobalamin (vit B-12) 1,000 mcg/mL injection solution   phentermine 37.5 MG capsule Take 1 capsule (37.5 mg total) by mouth every morning.   rizatriptan (MAXALT-MLT) 10 MG disintegrating tablet Take 1 tablet earliest onset of migraine.  May repeat in 2 hours if needed. Maximum 2 tablets in 24 hours.   tiZANidine (ZANAFLEX) 4 MG tablet Take 1 tablet (4 mg total) by mouth every 6 (six) hours as needed for muscle spasms.   Facility-Administered Encounter Medications as of 05/16/2022  Medication   cyanocobalamin ((VITAMIN B-12)) injection 1,000 mcg    Allergies: Allergies  Allergen Reactions   Septra [Sulfamethoxazole-Trimethoprim]    Tetracycline Swelling    Family History: Family History  Problem Relation Age of Onset   Migraines Mother    Rheum arthritis Maternal Grandmother    Migraines Maternal Grandmother    Depression Maternal Grandmother    Rashes / Skin problems Daughter        Ezcema   Depression Maternal Aunt    Alcohol abuse Father     Observations/Objective:   No acute distress.  Alert and oriented.  Speech fluent and not dysarthric.  Language intact.     Follow Up Instructions:    -I discussed the assessment and treatment plan with the patient. The patient was provided an opportunity to ask questions and all were answered. The patient agreed with the plan and demonstrated an understanding of the instructions.   The patient was advised to call back or seek an in-person evaluation if the symptoms worsen or if the condition fails to improve as anticipated.   Cira Servant, DO   CC: Wilhelmina Mcardle, NP

## 2022-05-16 ENCOUNTER — Encounter: Payer: Self-pay | Admitting: Neurology

## 2022-05-16 ENCOUNTER — Telehealth (INDEPENDENT_AMBULATORY_CARE_PROVIDER_SITE_OTHER): Admitting: Neurology

## 2022-05-16 VITALS — Ht 63.0 in | Wt 155.0 lb

## 2022-05-16 DIAGNOSIS — M542 Cervicalgia: Secondary | ICD-10-CM | POA: Diagnosis not present

## 2022-05-16 DIAGNOSIS — G43009 Migraine without aura, not intractable, without status migrainosus: Secondary | ICD-10-CM

## 2022-05-16 MED ORDER — RIZATRIPTAN BENZOATE 10 MG PO TBDP: ORAL_TABLET | ORAL | 5 refills | Status: DC

## 2022-05-16 MED ORDER — TIZANIDINE HCL 4 MG PO TABS: 4.0000 mg | ORAL_TABLET | Freq: Four times a day (QID) | ORAL | 5 refills | Status: DC | PRN

## 2022-09-19 NOTE — Progress Notes (Unsigned)
Katherine Hansen [place] [address] [place] Phone: [phone] Subjective:   Katherine Hansen, am serving as a scribe for Dr. Hulan Saas.  I'm seeing this patient by the request  of:  Jaffe DO   CC: Headache and neck pain  [id number]  Katherine Hansen is a 37 y.o. female coming in with complaint of neck pain. Pain in neck and trap area causing migraines.  Patient is seen neurology.  Patient was referred here by them.  Patient has headaches quite frequently she states.  Does take the Zanaflex and has had some improvement.  Does notice that some of the headaches to start on the posterior aspect.  Has noticed tightness in the neck.  No radiation down the arms.  It does not wake her up at night.  Can affect daily at     Past Medical History:  Diagnosis Date   Labor and delivery, indication for care 07/08/2017   Medical history non-contributory    Pilonidal cyst    Past Surgical History:  Procedure Laterality Date   NO PAST SURGERIES     none     Social History   Socioeconomic History   Marital status: Married    Spouse name: Not on file   Number of children: Not on file   Years of education: Not on file   Highest education level: Not on file  Occupational History   Not on file  Tobacco Use   Smoking status: Former    Packs/day: 0.00    Years: 0.00    Total pack years: 0.00    Types: Cigarettes    Quit date: 07/08/2006    Years since quitting: 16.2   Smokeless tobacco: Never  Vaping Use   Vaping Use: Never used  Substance and Sexual Activity   Alcohol use: Yes    Alcohol/week: 2.0 - 3.0 standard drinks of alcohol    Types: 2 - 3 Standard drinks or equivalent per week    Comment: not every week, occasional   Drug use: No   Sexual activity: Yes    Birth control/protection: Coitus interruptus, Pill  Other Topics Concern   Not on file  Social History Narrative   Right handed   Drinks caffeine   Split level home    Social Determinants of Health   Financial Resource Strain: Not on file  Food Insecurity: Not on file  Transportation Needs: Not on file  Physical Activity: Inactive (12/20/2017)   Exercise Vital Sign    Days of Exercise per Week: 0 days    Minutes of Exercise per Session: 0 min  Stress: Not on file  Social Connections: Not on file   Allergies  Allergen Reactions   Septra [Sulfamethoxazole-Trimethoprim]    Tetracycline Swelling   Family History  Problem Relation Age of Onset   Migraines Mother    Rheum arthritis Maternal Grandmother    Migraines Maternal Grandmother    Depression Maternal Grandmother    Rashes / Skin problems Daughter        Ezcema   Depression Maternal Aunt    Alcohol abuse Father           Current Outpatient Medications (Analgesics):    rizatriptan (MAXALT-MLT) 10 MG disintegrating tablet, Take 1 tablet earliest onset of migraine.  May repeat in 2 hours if needed. Maximum 2 tablets in 24 hours.   Current Outpatient Medications (Hematological):    cyanocobalamin (,VITAMIN B-12,) 1000 MCG/ML injection, cyanocobalamin (vit B-12) 1,000  mcg/mL injection solution  Current Facility-Administered Medications (Hematological):    cyanocobalamin ((VITAMIN B-12)) injection 1,000 mcg  Current Outpatient Medications (Other):    citalopram (CELEXA) 20 MG tablet, TAKE 1 TABLET BY MOUTH DAILY   phentermine 37.5 MG capsule, Take 1 capsule (37.5 mg total) by mouth every morning.   tiZANidine (ZANAFLEX) 4 MG tablet, Take 1 tablet (4 mg total) by mouth every 6 (six) hours as needed for muscle spasms.    Reviewed prior external information including notes and imaging from  primary care provider As well as notes that were available from care everywhere and other healthcare systems.  Past medical history, social, surgical and family history all reviewed in electronic medical record.  No pertanent information unless stated regarding to the chief complaint.    Review of Systems:  No , visual changes, nausea, vomiting, diarrhea, constipation, dizziness, abdominal pain, skin rash, fevers, chills, night sweats, weight loss, swollen lymph nodes, body aches, joint swelling, chest pain, shortness of breath, mood changes. POSITIVE muscle aches, headache  Objective  Blood pressure 116/76, pulse 86, height 5' 3"$  (1.6 m), weight 171 lb (77.6 kg), SpO2 98 %.   General: No apparent distress alert and oriented x3 mood and affect normal, dressed appropriately.  HEENT: Pupils equal, extraocular movements intact  Respiratory: Patient's speak in full sentences and does not appear short of breath  Cardiovascular: No lower extremity edema, non tender, no erythema  Neck exam does have some mild loss of lordosis.  Patient does have more tightness with flexion of the neck noted in extension.  Negative Spurling's noted.  Significant tightness noted in the scapular region right greater than left.  Osteopathic findings C2 flexed rotated and side bent right C4 flexed rotated and side bent left C6 flexed rotated and side bent left T4 extended rotated and side bent right inhaled third rib T7 extended rotated and side bent left     Impression and Recommendations:    The above documentation has been reviewed and is accurate and complete Lyndal Pulley, DO

## 2022-09-20 ENCOUNTER — Encounter: Payer: Self-pay | Admitting: Family Medicine

## 2022-09-20 ENCOUNTER — Ambulatory Visit (INDEPENDENT_AMBULATORY_CARE_PROVIDER_SITE_OTHER): Admitting: Family Medicine

## 2022-09-20 ENCOUNTER — Ambulatory Visit (INDEPENDENT_AMBULATORY_CARE_PROVIDER_SITE_OTHER)

## 2022-09-20 VITALS — BP 116/76 | HR 86 | Ht 63.0 in | Wt 171.0 lb

## 2022-09-20 DIAGNOSIS — M9908 Segmental and somatic dysfunction of rib cage: Secondary | ICD-10-CM

## 2022-09-20 DIAGNOSIS — G4486 Cervicogenic headache: Secondary | ICD-10-CM | POA: Diagnosis not present

## 2022-09-20 DIAGNOSIS — M9902 Segmental and somatic dysfunction of thoracic region: Secondary | ICD-10-CM

## 2022-09-20 DIAGNOSIS — M542 Cervicalgia: Secondary | ICD-10-CM

## 2022-09-20 DIAGNOSIS — M9901 Segmental and somatic dysfunction of cervical region: Secondary | ICD-10-CM

## 2022-09-20 NOTE — Patient Instructions (Addendum)
Do prescribed exercises at least 3x a week Xray today Vit D 2000iu daily See you again in 6-7 weeks

## 2022-09-20 NOTE — Assessment & Plan Note (Signed)
Patient does have what appears to be more of a cervicogenic headache.  We discussed with patient icing regimen and home exercises, which activities to do and which ones to avoid, we discussed posture and ergonomics throughout the day.  Patient does have to pick up a medium trial by symptom seems to be causing exacerbations of the pain as well.  Discussed with patient icing regimen and home exercises, oral medications but did agree with the Zanaflex to have it as a breakthrough option. Patient will try this but we also have formal physical therapy is a possibility.  X-rays of the neck ordered today to make sure there is no bony abnormality that is contributing as well.  Follow-up again in 6 to 8 weeks.

## 2022-09-20 NOTE — Assessment & Plan Note (Addendum)
   Decision today to treat with OMT was based on Physical Exam  After verbal consent patient was treated with HVLA, ME, FPR techniques in cervical, thoracic, rib,areas, all areas are chronic   Patient tolerated the procedure well with improvement in symptoms  Patient given exercises, stretches and lifestyle modifications  See medications in patient instructions if given  Patient will follow up in 4-8 weeks 

## 2022-11-06 NOTE — Progress Notes (Unsigned)
[facility] [address] [place] [address] Phone: [phone] Subjective:   Katherine Hansen, am serving as a scribe for Dr. Antoine Primas.  I'm seeing this patient by the request  of:  Galen Manila, NP (Inactive)  CC: Back and neck pain follow-up  [id number]  Katherine Hansen is a 37 y.o. female coming in with complaint of back and neck pain. OMT on 09/20/2022. Patient states that she has had one migraine since last visit.  Feels like she is making significant strides.  Medications patient has been prescribed:   Taking:         Reviewed prior external information including notes and imaging from previsou exam, outside providers and external EMR if available.   As well as notes that were available from care everywhere and other healthcare systems.  Past medical history, social, surgical and family history all reviewed in electronic medical record.  No pertanent information unless stated regarding to the chief complaint.   Past Medical History:  Diagnosis Date   Labor and delivery, indication for care 07/08/2017   Medical history non-contributory    Pilonidal cyst     Allergies  Allergen Reactions   Septra [Sulfamethoxazole-Trimethoprim]    Tetracycline Swelling     Review of Systems:  No headache, visual changes, nausea, vomiting, diarrhea, constipation, dizziness, abdominal pain, skin rash, fevers, chills, night sweats, weight loss, swollen lymph nodes, body aches, joint swelling, chest pain, shortness of breath, mood changes. POSITIVE muscle aches  Objective  Blood pressure 122/80, pulse 83, height 5\' 3"  (1.6 m), weight 173 lb (78.5 kg), SpO2 98 %.   General: No apparent distress alert and oriented x3 mood and affect normal, dressed appropriately.  HEENT: Pupils equal, extraocular movements intact  Respiratory: Patient's speak in full sentences and does not appear short of breath  Cardiovascular: No lower  extremity edema, non tender, no erythema  Low back still has some mild loss of lordosis.  Patient does have some very mild increase in kyphosis of the upper thoracic spine.  Neck exam does still have some limited sidebending bilaterally.  Significant tightness at the occipital cervical area.  Osteopathic findings  C2 flexed rotated and side bent right C3 flexed rotated and side bent left C6 flexed rotated and side bent left T4 extended rotated and side bent right inhaled rib T9 extended rotated and side bent left       Assessment and Plan:  Cervicogenic headache Cervicogenic headache.  Discussed icing regimen and home exercise, discussed which activities to do and which ones to avoid.  Patient is making great strides with her overall health.  Has had significant decrease in the amount of headaches.  Feels that the treatment plan is working and follow-up again in 2 to 3 months for further evaluation and treatment    Nonallopathic problems  Decision today to treat with OMT was based on Physical Exam  After verbal consent patient was treated with HVLA, ME, FPR techniques in cervical, rib, thoracic,areas  Patient tolerated the procedure well with improvement in symptoms  Patient given exercises, stretches and lifestyle modifications  See medications in patient instructions if given  Patient will follow up in 4-8 weeks     The above documentation has been reviewed and is accurate and complete Judi Saa, DO         Note: This dictation was prepared with Dragon dictation along with smaller phrase technology. Any transcriptional errors that result from this process  are unintentional.

## 2022-11-07 ENCOUNTER — Ambulatory Visit (INDEPENDENT_AMBULATORY_CARE_PROVIDER_SITE_OTHER): Admitting: Family Medicine

## 2022-11-07 VITALS — BP 122/80 | HR 83 | Ht 63.0 in | Wt 173.0 lb

## 2022-11-07 DIAGNOSIS — M9902 Segmental and somatic dysfunction of thoracic region: Secondary | ICD-10-CM | POA: Diagnosis not present

## 2022-11-07 DIAGNOSIS — M9908 Segmental and somatic dysfunction of rib cage: Secondary | ICD-10-CM

## 2022-11-07 DIAGNOSIS — G4486 Cervicogenic headache: Secondary | ICD-10-CM | POA: Diagnosis not present

## 2022-11-07 DIAGNOSIS — M9901 Segmental and somatic dysfunction of cervical region: Secondary | ICD-10-CM | POA: Diagnosis not present

## 2022-11-07 NOTE — Patient Instructions (Signed)
Great to see you Thank you for bringing your body guard  See me again before and after your summer trip

## 2022-11-08 ENCOUNTER — Encounter: Payer: Self-pay | Admitting: Family Medicine

## 2022-11-08 NOTE — Assessment & Plan Note (Signed)
Cervicogenic headache.  Discussed icing regimen and home exercise, discussed which activities to do and which ones to avoid.  Patient is making great strides with her overall health.  Has had significant decrease in the amount of headaches.  Feels that the treatment plan is working and follow-up again in 2 to 3 months for further evaluation and treatment

## 2023-01-10 NOTE — Progress Notes (Signed)
  [facility] [address] [place] [address] Phone: [phone] Subjective:   Bruce Donath, am serving as a scribe for Dr. Antoine Primas.  I'm seeing this patient by the request  of:  Galen Manila, NP (Inactive)  CC: Neck and back pain  [id number]  Katherine Hansen is a 37 y.o. female coming in with complaint of back and neck pain. OMT 11/07/2022. Patient states that after last visit. She had tightness in upper back after last visit and pain in neck musculature. Had migraine next day.           Reviewed prior external information including notes and imaging from previsou exam, outside providers and external EMR if available.   As well as notes that were available from care everywhere and other healthcare systems.  Past medical history, social, surgical and family history all reviewed in electronic medical record.  No pertanent information unless stated regarding to the chief complaint.   Past Medical History:  Diagnosis Date   Labor and delivery, indication for care 07/08/2017   Medical history non-contributory    Pilonidal cyst     Allergies  Allergen Reactions   Septra [Sulfamethoxazole-Trimethoprim]    Tetracycline Swelling     Review of Systems:  No  visual changes, nausea, vomiting, diarrhea, constipation, dizziness, abdominal pain, skin rash, fevers, chills, night sweats, weight loss, swollen lymph nodes, body aches, joint swelling, chest pain, shortness of breath, mood changes. POSITIVE muscle aches, headache  Objective  Blood pressure 110/72, pulse 74, height 5\' 3"  (1.6 m), weight 173 lb (78.5 kg), SpO2 98 %.   General: No apparent distress alert and oriented x3 mood and affect normal, dressed appropriately.  HEENT: Pupils equal, extraocular movements intact  Respiratory: Patient's speak in full sentences and does not appear short of breath  Cardiovascular: No lower extremity edema, non tender, no erythema   Neck exam does have some loss of lordosis noted.  Patient does have relatively decent though extension and flexion noted today.       Assessment and Plan:  Cervicogenic headache Continues to have difficulty with cervicogenic headaches.  Held on osteopathic manipulation secondary to the exacerbation because last time.  We discussed with patient about icing regimen and home exercises otherwise.  Discussed which activities to do and which ones to avoid.  Started on Effexor.  Warned of potential side effects but hopeful that this will make an improvement.  Follow-up with me again in 6 to 8 weeks.  Discontinued the citalopram        The above documentation has been reviewed and is accurate and complete Judi Saa, DO\        Note: This dictation was prepared with Dragon dictation along with smaller phrase technology. Any transcriptional errors that result from this process are unintentional.

## 2023-01-11 ENCOUNTER — Encounter: Payer: Self-pay | Admitting: Family Medicine

## 2023-01-11 ENCOUNTER — Ambulatory Visit (INDEPENDENT_AMBULATORY_CARE_PROVIDER_SITE_OTHER): Admitting: Family Medicine

## 2023-01-11 VITALS — BP 110/72 | HR 74 | Ht 63.0 in | Wt 173.0 lb

## 2023-01-11 DIAGNOSIS — G4486 Cervicogenic headache: Secondary | ICD-10-CM | POA: Diagnosis not present

## 2023-01-11 MED ORDER — VENLAFAXINE HCL ER 37.5 MG PO CP24: 37.5000 mg | ORAL_CAPSULE | Freq: Every day | ORAL | 0 refills | Status: DC

## 2023-01-11 NOTE — Patient Instructions (Signed)
Stopped Citalopram Start Effexor 37.5 Keep appt just in case

## 2023-01-11 NOTE — Assessment & Plan Note (Signed)
Continues to have difficulty with cervicogenic headaches.  Held on osteopathic manipulation secondary to the exacerbation because last time.  We discussed with patient about icing regimen and home exercises otherwise.  Discussed which activities to do and which ones to avoid.  Started on Effexor.  Warned of potential side effects but hopeful that this will make an improvement.  Follow-up with me again in 6 to 8 weeks.  Discontinued the citalopram

## 2023-02-07 ENCOUNTER — Other Ambulatory Visit: Payer: Self-pay | Admitting: Family Medicine

## 2023-02-12 NOTE — Progress Notes (Deleted)
  [facility] [address] [place] [address] Phone: [phone] Subjective:    I'm seeing this patient by the request  of:  Galen Manila, NP (Inactive)  CC:   [id number]  Katherine Hansen is a 37 y.o. female coming in with complaint of back and neck pain. OMT 01/11/2023. Medication change from citalopram to effexor. Patient states   Medications patient has been prescribed: Effexor  Taking:         Reviewed prior external information including notes and imaging from previsou exam, outside providers and external EMR if available.   As well as notes that were available from care everywhere and other healthcare systems.  Past medical history, social, surgical and family history all reviewed in electronic medical record.  No pertanent information unless stated regarding to the chief complaint.   Past Medical History:  Diagnosis Date   Labor and delivery, indication for care 07/08/2017   Medical history non-contributory    Pilonidal cyst     Allergies  Allergen Reactions   Septra [Sulfamethoxazole-Trimethoprim]    Tetracycline Swelling     Review of Systems:  No headache, visual changes, nausea, vomiting, diarrhea, constipation, dizziness, abdominal pain, skin rash, fevers, chills, night sweats, weight loss, swollen lymph nodes, body aches, joint swelling, chest pain, shortness of breath, mood changes. POSITIVE muscle aches  Objective  There were no vitals taken for this visit.   General: No apparent distress alert and oriented x3 mood and affect normal, dressed appropriately.  HEENT: Pupils equal, extraocular movements intact  Respiratory: Patient's speak in full sentences and does not appear short of breath  Cardiovascular: No lower extremity edema, non tender, no erythema  Gait MSK:  Back   Osteopathic findings  C2 flexed rotated and side bent right C6 flexed rotated and side bent left T3 extended rotated and  side bent right inhaled rib T9 extended rotated and side bent left L2 flexed rotated and side bent right Sacrum right on right       Assessment and Plan:  No problem-specific Assessment & Plan notes found for this encounter.    Nonallopathic problems  Decision today to treat with OMT was based on Physical Exam  After verbal consent patient was treated with HVLA, ME, FPR techniques in cervical, rib, thoracic, lumbar, and sacral  areas  Patient tolerated the procedure well with improvement in symptoms  Patient given exercises, stretches and lifestyle modifications  See medications in patient instructions if given  Patient will follow up in 4-8 weeks             Note: This dictation was prepared with Dragon dictation along with smaller phrase technology. Any transcriptional errors that result from this process are unintentional.

## 2023-02-13 ENCOUNTER — Ambulatory Visit: Admitting: Family Medicine

## 2023-02-21 ENCOUNTER — Other Ambulatory Visit: Payer: Self-pay | Admitting: Certified Nurse Midwife

## 2023-03-10 ENCOUNTER — Other Ambulatory Visit: Payer: Self-pay | Admitting: Family Medicine

## 2023-04-09 ENCOUNTER — Other Ambulatory Visit: Payer: Self-pay | Admitting: Family Medicine

## 2023-05-11 ENCOUNTER — Other Ambulatory Visit: Payer: Self-pay | Admitting: Family Medicine

## 2023-06-12 ENCOUNTER — Other Ambulatory Visit: Payer: Self-pay | Admitting: Family Medicine

## 2023-07-01 ENCOUNTER — Other Ambulatory Visit: Payer: Self-pay | Admitting: Neurology

## 2023-07-04 ENCOUNTER — Encounter: Payer: Self-pay | Admitting: Certified Nurse Midwife

## 2023-07-04 ENCOUNTER — Other Ambulatory Visit: Payer: Self-pay | Admitting: Certified Nurse Midwife

## 2023-07-04 MED ORDER — CITALOPRAM HYDROBROMIDE 10 MG PO TABS: 10.0000 mg | ORAL_TABLET | Freq: Every day | ORAL | 5 refills | Status: DC

## 2023-07-05 ENCOUNTER — Other Ambulatory Visit: Payer: Self-pay | Admitting: Certified Nurse Midwife

## 2023-07-09 ENCOUNTER — Other Ambulatory Visit: Payer: Self-pay | Admitting: Certified Nurse Midwife

## 2023-07-09 MED ORDER — RIZATRIPTAN BENZOATE 10 MG PO TBDP: ORAL_TABLET | ORAL | 5 refills | Status: AC

## 2023-07-09 MED ORDER — TIZANIDINE HCL 4 MG PO TABS: 4.0000 mg | ORAL_TABLET | Freq: Four times a day (QID) | ORAL | 5 refills | Status: AC | PRN

## 2024-02-05 ENCOUNTER — Other Ambulatory Visit: Payer: Self-pay | Admitting: Certified Nurse Midwife

## 2024-02-09 ENCOUNTER — Encounter: Payer: Self-pay | Admitting: Certified Nurse Midwife

## 2024-02-10 MED ORDER — CITALOPRAM HYDROBROMIDE 10 MG PO TABS: 10.0000 mg | ORAL_TABLET | Freq: Every day | ORAL | 0 refills | Status: DC

## 2024-02-25 NOTE — Progress Notes (Deleted)
 GYNECOLOGY ANNUAL PREVENTATIVE CARE ENCOUNTER NOTE  History:     Katherine Hansen is a 38 y.o. G49P3003 female here for a routine annual gynecologic exam.  Current complaints: ***.   Denies abnormal vaginal bleeding, discharge, pelvic pain, problems with intercourse or other gynecologic concerns.     Social Relationship: Living: Work: Exercise: Smoke/Alcohol/drug use:  Gynecologic History No LMP recorded. Contraception: {method:5051} Last Pap: ***. Results were: {norm/abn:16337} with negative HPV Last mammogram: ***. Results were: {norm/abn:16337}  Obstetric History OB History  Gravida Para Term Preterm AB Living  3 3 3   3   SAB IAB Ectopic Multiple Live Births     0 3    # Outcome Date GA Lbr Len/2nd Weight Sex Type Anes PTL Lv  3 Term 07/09/17 [redacted]w[redacted]d / 00:11 7 lb 7.9 oz (3.4 kg) F Vag-Spont None  LIV  2 Term 08/27/09 [redacted]w[redacted]d  8 lb 9 oz (3.884 kg) M Vag-Spont None  LIV  1 Term 02/18/07 [redacted]w[redacted]d  7 lb 4 oz (3.289 kg) F Vag-Spont EPI  LIV    Past Medical History:  Diagnosis Date   Labor and delivery, indication for care 07/08/2017   Medical history non-contributory    Pilonidal cyst     Past Surgical History:  Procedure Laterality Date   NO PAST SURGERIES     none      Current Outpatient Medications on File Prior to Visit  Medication Sig Dispense Refill   citalopram  (CELEXA ) 10 MG tablet Take 1 tablet (10 mg total) by mouth daily. 30 tablet 0   rizatriptan  (MAXALT -MLT) 10 MG disintegrating tablet Take 1 tablet earliest onset of migraine.  May repeat in 2 hours if needed. Maximum 2 tablets in 24 hours. 10 tablet 5   tiZANidine  (ZANAFLEX ) 4 MG tablet Take 1 tablet (4 mg total) by mouth every 6 (six) hours as needed for muscle spasms. 30 tablet 5   Current Facility-Administered Medications on File Prior to Visit  Medication Dose Route Frequency Provider Last Rate Last Admin   cyanocobalamin  ((VITAMIN B-12)) injection 1,000 mcg  1,000 mcg Intramuscular Once  Thompson, Annie, CNM        Allergies  Allergen Reactions   Septra [Sulfamethoxazole-Trimethoprim]    Tetracycline Swelling    Social History:  reports that she quit smoking about 17 years ago. Her smoking use included cigarettes. She has never used smokeless tobacco. She reports current alcohol use of about 2.0 - 3.0 standard drinks of alcohol per week. She reports that she does not use drugs.  Family History  Problem Relation Age of Onset   Migraines Mother    Rheum arthritis Maternal Grandmother    Migraines Maternal Grandmother    Depression Maternal Grandmother    Rashes / Skin problems Daughter        Ezcema   Depression Maternal Aunt    Alcohol abuse Father     The following portions of the patient's history were reviewed and updated as appropriate: allergies, current medications, past family history, past medical history, past social history, past surgical history and problem list.  Review of Systems Pertinent items noted in HPI and remainder of comprehensive ROS otherwise negative.  Physical Exam:  There were no vitals taken for this visit. CONSTITUTIONAL: Well-developed, well-nourished female in no acute distress.  HENT:  Normocephalic, atraumatic, External right and left ear normal. Oropharynx is clear and moist EYES: Conjunctivae and EOM are normal. Pupils are equal, round, and reactive to light. No  scleral icterus.  NECK: Normal range of motion, supple, no masses.  Normal thyroid .  SKIN: Skin is warm and dry. No rash noted. Not diaphoretic. No erythema. No pallor. MUSCULOSKELETAL: Normal range of motion. No tenderness.  No cyanosis, clubbing, or edema.  2+ distal pulses. NEUROLOGIC: Alert and oriented to person, place, and time. Normal reflexes, muscle tone coordination.  PSYCHIATRIC: Normal mood and affect. Normal behavior. Normal judgment and thought content. CARDIOVASCULAR: Normal heart rate noted, regular rhythm RESPIRATORY: Clear to auscultation bilaterally.  Effort and breath sounds normal, no problems with respiration noted. BREASTS: Symmetric in size. No masses, tenderness, skin changes, nipple drainage, or lymphadenopathy bilaterally.  ABDOMEN: Soft, no distention noted.  No tenderness, rebound or guarding.  PELVIC: Normal appearing external genitalia and urethral meatus; normal appearing vaginal mucosa and cervix.  No abnormal discharge noted.  Pap smear obtained.  Normal uterine size, no other palpable masses, no uterine or adnexal tenderness.  .   Assessment and Plan:    There are no diagnoses linked to this encounter. Will follow up results of pap smear and manage accordingly. Pap: Mammogram : Labs: Refills: Referral: Routine preventative health maintenance measures emphasized. Please refer to After Visit Summary for other counseling recommendations.      Zelda Hummer, CNM [facility]  [facility],  [facility]

## 2024-02-26 ENCOUNTER — Ambulatory Visit: Admitting: Certified Nurse Midwife

## 2024-02-26 DIAGNOSIS — Z01419 Encounter for gynecological examination (general) (routine) without abnormal findings: Secondary | ICD-10-CM

## 2024-03-12 ENCOUNTER — Other Ambulatory Visit: Payer: Self-pay | Admitting: Certified Nurse Midwife

## 2024-04-02 NOTE — Progress Notes (Unsigned)
 GYNECOLOGY ANNUAL PREVENTATIVE CARE ENCOUNTER NOTE  History:     Katherine Hansen is a 38 y.o. G14P3003 female here for a routine annual gynecologic exam.  Current complaints: perimenopausal changes, low libido, mood , fatigue, and weight gain.   Denies abnormal vaginal bleeding, discharge, pelvic pain, problems with intercourse or other gynecologic concerns.     Social Relationship:Married Living: husband and 3 children Work: Scientist, physiological  Exercise: walking 30 min daily Smoke/Alcohol/drug use: Former Smoker/ Occasional Alcohol use/ No history of drug use.   Gynecologic History Patient's last menstrual period was 03/15/2024 (exact date). Contraception: none Pap: 12/20/2017 Negative Mammogram: N/a   Obstetric History OB History  Gravida Para Term Preterm AB Living  3 3 3   3   SAB IAB Ectopic Multiple Live Births     0 3    # Outcome Date GA Lbr Len/2nd Weight Sex Type Anes PTL Lv  3 Term 07/09/17 [redacted]w[redacted]d / 00:11 7 lb 7.9 oz (3.4 kg) F Vag-Spont None  LIV  2 Term 08/27/09 [redacted]w[redacted]d  8 lb 9 oz (3.884 kg) M Vag-Spont None  LIV  1 Term 02/18/07 [redacted]w[redacted]d  7 lb 4 oz (3.289 kg) F Vag-Spont EPI  LIV    Past Medical History:  Diagnosis Date   Labor and delivery, indication for care 07/08/2017   Medical history non-contributory    Pilonidal cyst     Past Surgical History:  Procedure Laterality Date   NO PAST SURGERIES     none      Current Outpatient Medications on File Prior to Visit  Medication Sig Dispense Refill   citalopram  (CELEXA ) 10 MG tablet TAKE 1 TABLET(10 MG) BY MOUTH DAILY 30 tablet 0   rizatriptan  (MAXALT -MLT) 10 MG disintegrating tablet Take 1 tablet earliest onset of migraine.  May repeat in 2 hours if needed. Maximum 2 tablets in 24 hours. 10 tablet 5   tiZANidine  (ZANAFLEX ) 4 MG tablet Take 1 tablet (4 mg total) by mouth every 6 (six) hours as needed for muscle spasms. 30 tablet 5   Current Facility-Administered Medications on File Prior to Visit   Medication Dose Route Frequency Provider Last Rate Last Admin   cyanocobalamin  ((VITAMIN B-12)) injection 1,000 mcg  1,000 mcg Intramuscular Once Nelsy Madonna, CNM        Allergies  Allergen Reactions   Tetracycline Swelling and Other (See Comments)   Sulfamethoxazole-Trimethoprim Other (See Comments)    Social History:  reports that she quit smoking about 17 years ago. Her smoking use included cigarettes. She has never used smokeless tobacco. She reports current alcohol use of about 2.0 - 3.0 standard drinks of alcohol per week. She reports that she does not use drugs.  Family History  Problem Relation Age of Onset   Migraines Mother    Rheum arthritis Maternal Grandmother    Migraines Maternal Grandmother    Depression Maternal Grandmother    Rashes / Skin problems Daughter        Ezcema   Depression Maternal Aunt    Alcohol abuse Father     The following portions of the patient's history were reviewed and updated as appropriate: allergies, current medications, past family history, past medical history, past social history, past surgical history and problem list.  Review of Systems Pertinent items noted in HPI and remainder of comprehensive ROS otherwise negative.  Physical Exam:  BP 131/79   Pulse 79   Ht 5' 3 (1.6 m)   Wt 183 lb  9.6 oz (83.3 kg)   LMP 03/15/2024 (Exact Date)   BMI 32.52 kg/m  CONSTITUTIONAL: Well-developed, well-nourished female in no acute distress.  HENT:  Normocephalic, atraumatic, External right and left ear normal. Oropharynx is clear and moist EYES: Conjunctivae and EOM are normal. Pupils are equal, round, and reactive to light. No scleral icterus.  NECK: Normal range of motion, supple, no masses.  Normal thyroid .  SKIN: Skin is warm and dry. No rash noted. Not diaphoretic. No erythema. No pallor. MUSCULOSKELETAL: Normal range of motion. No tenderness.  No cyanosis, clubbing, or edema.  2+ distal pulses. NEUROLOGIC: Alert and oriented to  person, place, and time. Normal reflexes, muscle tone coordination.  PSYCHIATRIC: Normal mood and affect. Normal behavior. Normal judgment and thought content. CARDIOVASCULAR: Normal heart rate noted, regular rhythm RESPIRATORY: Clear to auscultation bilaterally. Effort and breath sounds normal, no problems with respiration noted. BREASTS: Symmetric in size. No masses, tenderness, skin changes, nipple drainage, or lymphadenopathy bilaterally.  ABDOMEN: Soft, no distention noted.  No tenderness, rebound or guarding.  PELVIC: Normal appearing external genitalia and urethral meatus; normal appearing vaginal mucosa and cervix.  No abnormal discharge noted.  Pap smear obtained.  Normal uterine size, no other palpable masses, no uterine or adnexal tenderness.  .   Assessment and Plan:    There are no diagnoses linked to this encounter.  Pap: Will follow up results of pap smear and manage accordingly. Mammogram : n/a  Labs: none  Refills: celexa   Referral: none  Routine preventative health maintenance measures emphasized. Discussed treatment options for perimenopausal symptoms. She will consider. Declines at this time.  Please refer to After Visit Summary for other counseling recommendations.      Zelda Hummer, CNM [facility]  [facility],  [facility]

## 2024-04-03 ENCOUNTER — Other Ambulatory Visit (HOSPITAL_COMMUNITY)
Admission: RE | Admit: 2024-04-03 | Discharge: 2024-04-03 | Disposition: A | Discharge status: Home / Self Care | Source: Ambulatory Visit | Attending: Certified Nurse Midwife | Admitting: Certified Nurse Midwife

## 2024-04-03 ENCOUNTER — Encounter: Payer: Self-pay | Admitting: Certified Nurse Midwife

## 2024-04-03 ENCOUNTER — Ambulatory Visit (INDEPENDENT_AMBULATORY_CARE_PROVIDER_SITE_OTHER): Admitting: Certified Nurse Midwife

## 2024-04-03 VITALS — BP 131/79 | HR 79 | Ht 63.0 in | Wt 183.6 lb

## 2024-04-03 DIAGNOSIS — Z01419 Encounter for gynecological examination (general) (routine) without abnormal findings: Secondary | ICD-10-CM | POA: Insufficient documentation

## 2024-04-03 DIAGNOSIS — Z124 Encounter for screening for malignant neoplasm of cervix: Secondary | ICD-10-CM

## 2024-04-03 MED ORDER — CITALOPRAM HYDROBROMIDE 10 MG PO TABS: 10.0000 mg | ORAL_TABLET | Freq: Every day | ORAL | 3 refills | Status: AC

## 2024-04-03 NOTE — Patient Instructions (Addendum)
 Preventive Care 48-38 Years Old, Female Preventive care refers to lifestyle choices and visits with your health care provider that can promote health and wellness. Preventive care visits are also called wellness exams. What can I expect for my preventive care visit? Counseling During your preventive care visit, your health care provider may ask about your: Medical history, including: Past medical problems. Family medical history. Pregnancy history. Current health, including: Menstrual cycle. Method of birth control. Emotional well-being. Home life and relationship well-being. Sexual activity and sexual health. Lifestyle, including: Alcohol, nicotine  or tobacco, and drug use. Access to firearms. Diet, exercise, and sleep habits. Work and work Astronomer. Sunscreen use. Safety issues such as seatbelt and bike helmet use. Physical exam Your health care provider may check your: Height and weight. These may be used to calculate your BMI (body mass index). BMI is a measurement that tells if you are at a healthy weight. Waist circumference. This measures the distance around your waistline. This measurement also tells if you are at a healthy weight and may help predict your risk of certain diseases, such as type 2 diabetes and high blood pressure. Heart rate and blood pressure. Body temperature. Skin for abnormal spots. What immunizations do I need?  Vaccines are usually given at various ages, according to a schedule. Your health care provider will recommend vaccines for you based on your age, medical history, and lifestyle or other factors, such as travel or where you work. What tests do I need? Screening Your health care provider may recommend screening tests for certain conditions. This may include: Pelvic exam and Pap test. Lipid and cholesterol levels. Diabetes screening. This is done by checking your blood sugar (glucose) after you have not eaten for a while (fasting). Hepatitis  B test. Hepatitis C test. HIV (human immunodeficiency virus) test. STI (sexually transmitted infection) testing, if you are at risk. BRCA-related cancer screening. This may be done if you have a family history of breast, ovarian, tubal, or peritoneal cancers. Talk with your health care provider about your test results, treatment options, and if necessary, the need for more tests. Follow these instructions at home: Eating and drinking  Eat a healthy diet that includes fresh fruits and vegetables, whole grains, lean protein, and low-fat dairy products. Take vitamin and mineral supplements as recommended by your health care provider. Do not drink alcohol if: Your health care provider tells you not to drink. You are pregnant, may be pregnant, or are planning to become pregnant. If you drink alcohol: Limit how much you have to 0-1 drink a day. Know how much alcohol is in your drink. In the [place] one drink equals one 12 oz bottle of beer (355 mL), one 5 oz glass of wine (148 mL), or one 1 oz glass of hard liquor (44 mL). Lifestyle Brush your teeth every morning and night with fluoride toothpaste. Floss one time each day. Exercise for at least 30 minutes 5 or more days each week. Do not use any products that contain nicotine  or tobacco. These products include cigarettes, chewing tobacco, and vaping devices, such as e-cigarettes. If you need help quitting, ask your health care provider. Do not use drugs. If you are sexually active, practice safe sex. Use a condom or other form of protection to prevent STIs. If you do not wish to become pregnant, use a form of birth control. If you plan to become pregnant, see your health care provider for a prepregnancy visit. Find healthy ways to manage stress, such as: Meditation,  yoga, or listening to music. Journaling. Talking to a trusted person. Spending time with friends and family. Minimize exposure to UV radiation to reduce your risk of skin  cancer. Safety Always wear your seat belt while driving or riding in a vehicle. Do not drive: If you have been drinking alcohol. Do not ride with someone who has been drinking. If you have been using any mind-altering substances or drugs. While texting. When you are tired or distracted. Wear a helmet and other protective equipment during sports activities. If you have firearms in your house, make sure you follow all gun safety procedures. Seek help if you have been physically or sexually abused. What's next? Go to your health care provider once a year for an annual wellness visit. Ask your health care provider how often you should have your eyes and teeth checked. Stay up to date on all vaccines. This information is not intended to replace advice given to you by your health care provider. Make sure you discuss any questions you have with your health care provider. Document Revised: 01/11/2021 Document Reviewed: 01/11/2021 Elsevier Patient Education  2024 [employer]Fat and Cholesterol Restricted Eating Plan Getting too much fat and cholesterol in your diet may cause health problems. Choosing the right foods helps keep your fat and cholesterol at normal levels. This can keep you from getting certain diseases. Your doctor may recommend an eating plan that includes: Total fat: ______% or less of total calories a day. This is ______g of fat a day. Saturated fat: ______% or less of total calories a day. This is ______g of saturated fat a day. Cholesterol: less than _________mg a day. Fiber: ______g a day. What are tips for following this plan? General tips Work with your doctor to lose weight if you need to. Avoid: Foods with added sugar. Fried foods. Foods with trans fat or partially hydrogenated oils. This includes some margarines and baked goods. If you drink alcohol: Limit how much you have to: 0-1 drink a day for women who are not pregnant. 0-2 drinks a day for men. Know how  much alcohol is in a drink. In the [place] one drink equals one 12 oz bottle of beer (355 mL), one 5 oz glass of wine (148 mL), or one 1 oz glass of hard liquor (44 mL). Reading food labels Check food labels for: Trans fats. Partially hydrogenated oils. Saturated fat (g) in each serving. Cholesterol (mg) in each serving. Fiber (g) in each serving. Choose foods with healthy fats, such as: Monounsaturated fats and polyunsaturated fats. These include olive and canola oil, flaxseeds, walnuts, almonds, and seeds. Omega-3 fats. These are found in certain fish, flaxseed oil, and ground flaxseeds. Choose grain products that have whole grains. Look for the word whole as the first word in the ingredient list. Cooking Cook foods using low-fat methods. These include baking, boiling, grilling, and broiling. Eat more home-cooked foods. Eat at restaurants and buffets less often. Eat less fast food. Avoid cooking using saturated fats, such as butter, cream, palm oil, palm kernel oil, and coconut oil. Meal planning  At meals, divide your plate into four equal parts: Fill one-half of your plate with vegetables, green salads, and fruit. Fill one-fourth of your plate with whole grains. Fill one-fourth of your plate with low-fat (lean) protein foods. Eat fish that is high in omega-3 fats at least two times a week. This includes mackerel, tuna, sardines, and salmon. Eat foods that are high in fiber, such as whole grains, beans, apples,  pears, berries, broccoli, carrots, peas, and barley. What foods should I eat? Fruits All fresh, canned (in natural juice), or frozen fruits. Vegetables Fresh or frozen vegetables (raw, steamed, roasted, or grilled). Green salads. Grains Whole grains, such as whole wheat or whole grain breads, crackers, cereals, and pasta. Unsweetened oatmeal, bulgur, barley, quinoa, or brown rice. Corn or whole wheat flour tortillas. Meats and other protein foods Ground beef (85% or  leaner), grass-fed beef, or beef trimmed of fat. Skinless chicken or [place]. Ground chicken or [place]. Pork trimmed of fat. All fish and seafood. Egg whites. Dried beans, peas, or lentils. Unsalted nuts or seeds. Unsalted canned beans. Nut butters without added sugar or oil. Dairy Low-fat or nonfat dairy products, such as skim or 1% milk, 2% or reduced-fat cheeses, low-fat and fat-free ricotta or cottage cheese, or plain low-fat and nonfat yogurt. Fats and oils Tub margarine without trans fats. Light or reduced-fat mayonnaise and salad dressings. Avocado. Olive, canola, sesame, or safflower oils. The items listed above may not be a complete list of foods and beverages you can eat. Contact a dietitian for more information. What foods should I avoid? Fruits Canned fruit in heavy syrup. Fruit in cream or butter sauce. Fried fruit. Vegetables Vegetables cooked in cheese, cream, or butter sauce. Fried vegetables. Grains White bread. White pasta. White rice. Cornbread. Bagels, pastries, and croissants. Crackers and snack foods that contain trans fat and hydrogenated oils. Meats and other protein foods Fatty cuts of meat. Ribs, chicken wings, bacon, sausage, bologna, salami, chitterlings, fatback, hot dogs, bratwurst, and packaged lunch meats. Liver and organ meats. Whole eggs and egg yolks. Chicken and [place] with skin. Fried meat. Dairy Whole or 2% milk, cream, half-and-half, and cream cheese. Whole milk cheeses. Whole-fat or sweetened yogurt. Full-fat cheeses. Nondairy creamers and whipped toppings. Processed cheese, cheese spreads, and cheese curds. Fats and oils Butter, stick margarine, lard, shortening, ghee, or bacon fat. Coconut, palm kernel, and palm oils. Beverages Alcohol. Sugar-sweetened drinks such as sodas, lemonade, and fruit drinks. Sweets and desserts Corn syrup, sugars, honey, and molasses. Candy. Jam and jelly. Syrup. Sweetened cereals. Cookies, pies, cakes, donuts, muffins, and ice  cream. The items listed above may not be a complete list of foods and beverages you should avoid. Contact a dietitian for more information. Summary Choosing the right foods helps keep your fat and cholesterol at normal levels. This can keep you from getting certain diseases. At meals, fill one-half of your plate with vegetables, green salads, and fruits. Eat high fiber foods, like whole grains, beans, apples, pears, berries, carrots, peas, and barley. Limit added sugar, saturated fats, alcohol, and fried foods. This information is not intended to replace advice given to you by your health care provider. Make sure you discuss any questions you have with your health care provider. Document Revised: 11/25/2020 Document Reviewed: 11/25/2020 Elsevier Patient Education  2024 [employer]Preventing High Cholesterol Cholesterol is a white, waxy substance similar to fat that the human body needs to help build cells. The liver makes all the cholesterol that a person's body needs. Having high cholesterol (hypercholesterolemia) increases your risk for heart disease and stroke. Extra or excess cholesterol comes from the food that you eat. High cholesterol can often be prevented with diet and lifestyle changes. If you already have high cholesterol, you can control it with diet, lifestyle changes, and medicines. How can high cholesterol affect me? If you have high cholesterol, fatty deposits (plaques) may build up on the walls of your blood vessels.  The blood vessels that carry blood away from your heart are called arteries. Plaques make the arteries narrower and stiffer. This in turn can: Restrict or block blood flow and cause blood clots to form. Increase your risk for heart attack and stroke. What can increase my risk for high cholesterol? This condition is more likely to develop in people who: Eat foods that are high in saturated fat or cholesterol. Saturated fat is mostly found in foods that come from  animal sources. Are overweight. Are not getting enough exercise. Use products that contain nicotine  or tobacco, such as cigarettes, e-cigarettes, and chewing tobacco. Have a family history of high cholesterol (familial hypercholesterolemia). What actions can I take to prevent this? Nutrition  Eat less saturated fat. Avoid trans fats (partially hydrogenated oils). These are often found in margarine and in some baked goods, fried foods, and snacks bought in packages. Avoid precooked or cured meat, such as bacon, sausages, or meat loaves. Avoid foods and drinks that have added sugars. Eat more fruits, vegetables, and whole grains. Choose healthy sources of protein, such as fish, poultry, lean cuts of red meat, beans, peas, lentils, and nuts. Choose healthy sources of fat, such as: Nuts. Vegetable oils, especially olive oil. Fish that have healthy fats, such as omega-3 fatty acids. These fish include mackerel or salmon. Lifestyle Lose weight if you are overweight. Maintaining a healthy body mass index (BMI) can help prevent or control high cholesterol. It can also lower your risk for diabetes and high blood pressure. Ask your health care provider to help you with a diet and exercise plan to lose weight safely. Do not use any products that contain nicotine  or tobacco. These products include cigarettes, chewing tobacco, and vaping devices, such as e-cigarettes. If you need help quitting, ask your health care provider. Alcohol use Do not drink alcohol if: Your health care provider tells you not to drink. You are pregnant, may be pregnant, or are planning to become pregnant. If you drink alcohol: Limit how much you have to: 0-1 drink a day for women. 0-2 drinks a day for men. Know how much alcohol is in your drink. In the [place] one drink equals one 12 oz bottle of beer (355 mL), one 5 oz glass of wine (148 mL), or one 1 oz glass of hard liquor (44 mL). Activity  Get enough exercise. Do  exercises as told by your health care provider. Each week, do at least 150 minutes of exercise that takes a medium level of effort (moderate-intensity exercise). This kind of exercise: Makes your heart beat faster while allowing you to still be able to talk. Can be done in short sessions several times a day or longer sessions a few times a week. For example, on 5 days each week, you could walk fast or ride your bike 3 times a day for 10 minutes each time. Medicines Your health care provider may recommend medicines to help lower cholesterol. This may be a medicine to lower the amount of cholesterol that your liver makes. You may need medicine if: Diet and lifestyle changes have not lowered your cholesterol enough. You have high cholesterol and other risk factors for heart disease or stroke. Take over-the-counter and prescription medicines only as told by your health care provider. General information Manage your risk factors for high cholesterol. Talk with your health care provider about all your risk factors and how to lower your risk. Manage other conditions that you have, such as diabetes or high blood pressure (  hypertension). Have blood tests to check your cholesterol levels at regular points in time as told by your health care provider. Keep all follow-up visits. This is important. Where to find more information American Heart Association: www.heart.org National Heart, Lung, and Blood Institute: [employer] Summary High cholesterol increases your risk for heart disease and stroke. By keeping your cholesterol level low, you can reduce your risk for these conditions. High cholesterol can often be prevented with diet and lifestyle changes. Work with your health care provider to manage your risk factors, and have your blood tested regularly. This information is not intended to replace advice given to you by your health care provider. Make sure you discuss any questions you have with your  health care provider. Document Revised: 02/16/2022 Document Reviewed: 09/19/2020 Elsevier Patient Education  2024 [employer].

## 2024-04-08 LAB — CYTOLOGY - PAP
Comment: NEGATIVE
Diagnosis: NEGATIVE
Diagnosis: REACTIVE
High risk HPV: NEGATIVE

## 2024-04-18 ENCOUNTER — Other Ambulatory Visit: Payer: Self-pay | Admitting: Certified Nurse Midwife

## 2024-07-05 ENCOUNTER — Ambulatory Visit

## 2024-07-05 ENCOUNTER — Encounter: Payer: Self-pay | Admitting: Emergency Medicine

## 2024-07-05 ENCOUNTER — Ambulatory Visit
Admission: EM | Admit: 2024-07-05 | Discharge: 2024-07-05 | Disposition: A | Discharge status: Home / Self Care | Attending: Physician Assistant | Admitting: Physician Assistant

## 2024-07-05 DIAGNOSIS — J029 Acute pharyngitis, unspecified: Secondary | ICD-10-CM

## 2024-07-05 DIAGNOSIS — J180 Bronchopneumonia, unspecified organism: Secondary | ICD-10-CM | POA: Diagnosis not present

## 2024-07-05 DIAGNOSIS — R051 Acute cough: Secondary | ICD-10-CM

## 2024-07-05 LAB — POCT RAPID STREP A (OFFICE): Rapid Strep A Screen: NEGATIVE

## 2024-07-05 LAB — POC COVID19/FLU A&B COMBO
Covid Antigen, POC: NEGATIVE
Influenza A Antigen, POC: NEGATIVE
Influenza B Antigen, POC: NEGATIVE

## 2024-07-05 MED ORDER — AZITHROMYCIN 250 MG PO TABS: 250.0000 mg | ORAL_TABLET | Freq: Every day | ORAL | 0 refills | Status: DC

## 2024-07-05 MED ORDER — PROMETHAZINE-DM 6.25-15 MG/5ML PO SYRP: 5.0000 mL | ORAL_SOLUTION | Freq: Four times a day (QID) | ORAL | 0 refills | Status: DC | PRN

## 2024-07-05 NOTE — ED Triage Notes (Signed)
 Patient c/o sore throat, cough, bodyaches, nasal congestion, and headaches that started on Friday.  Patient unsure of fevers.

## 2024-07-05 NOTE — Discharge Instructions (Addendum)
-   Negative COVID, flu and strep testing. - Radiologist did not see pneumonia on your x-ray but I do have some concerns about possible bronchopneumonia since it is very congested on the right side.  I sent antibiotic to the pharmacy for the next few days as well as cough medication.  Increase rest and fluids.  May take over-the-counter Tylenol  /Motrin  as needed for body aches and if you are feeling feverish. - If you feel like you are having a fever that does not break in the next couple days, cough worsens, you have increased breathing difficulty or weakness please return for reevaluation.

## 2024-07-05 NOTE — ED Provider Notes (Signed)
 [facility]    CSN: [id number] Arrival date & time: 07/05/24  9066      History   Chief Complaint Chief Complaint  Patient presents with   Headache   Sore Throat   Generalized Body Aches   Cough    HPI THRESEA DOBLE is a 38 y.o. female presenting for fatigue, cough, congestion, sore throat and body aches x 2 days.   Denies recorded fever, but has had chills and sweats. Denies ear pain, sinus pain, chest pain, wheezing, shortness of breath, abdominal pain, vomiting or diarrhea.  Patient has been taking over-the-counter meds, Theraflu. Reports sick co-workers. No other complaints.   HPI  Past Medical History:  Diagnosis Date   Labor and delivery, indication for care 07/08/2017   Medical history non-contributory    Pilonidal cyst     Patient Active Problem List   Diagnosis Date Noted   Cervicogenic headache 09/20/2022   Somatic dysfunction of spine, cervical 09/20/2022   Obesity (BMI 30-39.9) 01/31/2021   Anxiety 03/10/2019    Past Surgical History:  Procedure Laterality Date   NO PAST SURGERIES     none      OB History     Gravida  3   Para  3   Term  3   Preterm      AB      Living  3      SAB      IAB      Ectopic      Multiple  0   Live Births  3            Home Medications    Prior to Admission medications   Medication Sig Start Date End Date Taking? Authorizing Provider  azithromycin  (ZITHROMAX ) 250 MG tablet Take 1 tablet (250 mg total) by mouth daily. Take first 2 tablets together, then 1 every day until finished. 07/05/24  Yes Arvis Huxley B, PA-C  promethazine -dextromethorphan (PROMETHAZINE -DM) 6.25-15 MG/5ML syrup Take 5 mLs by mouth 4 (four) times daily as needed. 07/05/24  Yes Arvis Huxley B, PA-C  citalopram  (CELEXA ) 10 MG tablet Take 1 tablet (10 mg total) by mouth daily. 04/03/24   Sebastian Sham, CNM  rizatriptan  (MAXALT -MLT) 10 MG disintegrating tablet Take 1 tablet earliest onset of migraine.  May  repeat in 2 hours if needed. Maximum 2 tablets in 24 hours. 07/09/23   Sebastian Sham, CNM  tiZANidine  (ZANAFLEX ) 4 MG tablet Take 1 tablet (4 mg total) by mouth every 6 (six) hours as needed for muscle spasms. 07/09/23   Sebastian Sham, CNM    Family History Family History  Problem Relation Age of Onset   Migraines Mother    Rheum arthritis Maternal Grandmother    Migraines Maternal Grandmother    Depression Maternal Grandmother    Rashes / Skin problems Daughter        Ezcema   Depression Maternal Aunt    Alcohol abuse Father     Social History Social History   Tobacco Use   Smoking status: Former    Current packs/day: 0.00    Types: Cigarettes    Quit date: 07/08/2006    Years since quitting: 18.0   Smokeless tobacco: Never  Vaping Use   Vaping status: Never Used  Substance Use Topics   Alcohol use: Yes    Alcohol/week: 2.0 - 3.0 standard drinks of alcohol    Types: 2 - 3 Standard drinks or equivalent per week    Comment: not every  week, occasional   Drug use: No     Allergies   Tetracycline and Sulfamethoxazole-trimethoprim   Review of Systems Review of Systems  Constitutional:  Positive for chills, diaphoresis and fatigue. Negative for fever.  HENT:  Positive for congestion, rhinorrhea and sore throat. Negative for ear pain, sinus pressure and sinus pain.   Respiratory:  Positive for cough. Negative for shortness of breath.   Cardiovascular:  Negative for chest pain.  Gastrointestinal:  Negative for abdominal pain, nausea and vomiting.  Musculoskeletal:  Positive for myalgias.  Skin:  Negative for rash.  Neurological:  Positive for headaches. Negative for weakness.  Hematological:  Negative for adenopathy.     Physical Exam Triage Vital Signs ED Triage Vitals  Encounter Vitals Group     BP 07/05/24 0946 121/82     Girls Systolic BP Percentile --      Girls Diastolic BP Percentile --      Boys Systolic BP Percentile --      Boys Diastolic BP  Percentile --      Pulse Rate 07/05/24 0946 (!) 110     Resp 07/05/24 0946 15     Temp 07/05/24 0946 98.8 F (37.1 C)     Temp Source 07/05/24 0946 Oral     SpO2 07/05/24 0946 94 %     Weight 07/05/24 0942 183 lb 10.3 oz (83.3 kg)     Height 07/05/24 0942 5' 3 (1.6 m)     Head Circumference --      Peak Flow --      Pain Score 07/05/24 0942 7     Pain Loc --      Pain Education --      Exclude from Growth Chart --    No data found.  Updated Vital Signs BP 121/82 (BP Location: Right Arm)   Pulse (!) 110   Temp 98.8 F (37.1 C) (Oral)   Resp 15   Ht 5' 3 (1.6 m)   Wt 183 lb 10.3 oz (83.3 kg)   LMP 06/14/2024 (Approximate)   SpO2 94%   BMI 32.53 kg/m      Physical Exam Vitals and nursing note reviewed.  Constitutional:      General: She is not in acute distress.    Appearance: Normal appearance. She is ill-appearing. She is not toxic-appearing.  HENT:     Head: Normocephalic and atraumatic.     Right Ear: Tympanic membrane, ear canal and external ear normal.     Left Ear: Tympanic membrane, ear canal and external ear normal.     Nose: Congestion present.     Mouth/Throat:     Mouth: Mucous membranes are moist.     Pharynx: Oropharynx is clear. Posterior oropharyngeal erythema present.  Eyes:     General: No scleral icterus.       Right eye: No discharge.        Left eye: No discharge.     Conjunctiva/sclera: Conjunctivae normal.  Cardiovascular:     Rate and Rhythm: Regular rhythm. Tachycardia present.     Heart sounds: Normal heart sounds.  Pulmonary:     Effort: Pulmonary effort is normal. No respiratory distress.     Breath sounds: Rhonchi (throughout right lung) present.  Musculoskeletal:     Cervical back: Neck supple.  Skin:    General: Skin is dry.  Neurological:     General: No focal deficit present.     Mental Status: She is alert. Mental status is at  baseline.     Motor: No weakness.     Gait: Gait normal.  Psychiatric:        Mood and  Affect: Mood normal.        Behavior: Behavior normal.      UC Treatments / Results  Labs (all labs ordered are listed, but only abnormal results are displayed) Labs Reviewed  POC COVID19/FLU A&B COMBO - Normal  POCT RAPID STREP A (OFFICE) - Normal    EKG   Radiology DG Chest 2 View Result Date: 07/05/2024 EXAM: 2 VIEW(S) XRAY OF THE CHEST 07/05/2024 10:30:00 AM COMPARISON: None available. CLINICAL HISTORY: cough, congestion, feeling feverish x 2 days. FINDINGS: LUNGS AND PLEURA: No focal pulmonary opacity. No pleural effusion. No pneumothorax. HEART AND MEDIASTINUM: No acute abnormality of the cardiac and mediastinal silhouettes. BONES AND SOFT TISSUES: No acute osseous abnormality. IMPRESSION: 1. No acute process. Electronically signed by: Lynwood Seip MD 07/05/2024 10:51 AM EST RP Workstation: [id number]    Procedures Procedures (including critical care time)  Medications Ordered in UC Medications - No data to display  Initial Impression / Assessment and Plan / UC Course  I have reviewed the triage vital signs and the nursing notes.  Pertinent labs & imaging results that were available during my care of the patient were reviewed by me and considered in my medical decision making (see chart for details).   38 y/o female presents for cough, congestion, body aches, fatigue, sore throat and headaches x 2 days. Denies recording a fever, but has felt feverish with chills and sweats. No chest pain or SOB.   Patient is afebrile. HR 110 bpm. NAD. On exam, she has nasal congestion and mild erythema of posterior pharynx. Chest is with scattered rhonchi of right lung. Heart regular rhythm.   Rapid strep negative. Rapid flu/COVID negative.  Will obtain chest x-ray to evaluate for possible pneumonia. Wet read concerning for right bronchopneumonia, but radiology over read is negative.   Treating clinically for bronchopneumonia with azithromycin  and promethazine  DM. Supportive care  advised. Discussed use of Tylenol /Motrin  as needed for headaches and body aches. Reviewed return precautions.  Acute illness with systemic symptoms.   Final Clinical Impressions(s) / UC Diagnoses   Final diagnoses:  Acute cough  Bronchopneumonia  Sore throat     Discharge Instructions      - Negative COVID, flu and strep testing. - Radiologist did not see pneumonia on your x-ray but I do have some concerns about possible bronchopneumonia since it is very congested on the right side.  I sent antibiotic to the pharmacy for the next few days as well as cough medication.  Increase rest and fluids.  May take over-the-counter Tylenol  /Motrin  as needed for body aches and if you are feeling feverish. - If you feel like you are having a fever that does not break in the next couple days, cough worsens, you have increased breathing difficulty or weakness please return for reevaluation.     ED Prescriptions     Medication Sig Dispense Auth. Provider   azithromycin  (ZITHROMAX ) 250 MG tablet Take 1 tablet (250 mg total) by mouth daily. Take first 2 tablets together, then 1 every day until finished. 6 tablet Arvis Huxley B, PA-C   promethazine -dextromethorphan (PROMETHAZINE -DM) 6.25-15 MG/5ML syrup Take 5 mLs by mouth 4 (four) times daily as needed. 118 mL Arvis Huxley NOVAK, PA-C      PDMP not reviewed this encounter.   Arvis Huxley NOVAK, PA-C 07/05/24 1108

## 2024-09-02 ENCOUNTER — Ambulatory Visit
Admission: EM | Admit: 2024-09-02 | Discharge: 2024-09-02 | Disposition: A | Discharge status: Home / Self Care | Source: Home / Self Care | Attending: Family Medicine | Admitting: Family Medicine

## 2024-09-02 DIAGNOSIS — R6889 Other general symptoms and signs: Secondary | ICD-10-CM | POA: Diagnosis not present

## 2024-09-02 DIAGNOSIS — J020 Streptococcal pharyngitis: Secondary | ICD-10-CM | POA: Diagnosis not present

## 2024-09-02 LAB — POCT INFLUENZA A/B
Influenza A, POC: NEGATIVE
Influenza B, POC: NEGATIVE

## 2024-09-02 LAB — POC SOFIA SARS ANTIGEN FIA: SARS Coronavirus 2 Ag: NEGATIVE

## 2024-09-02 LAB — POCT RAPID STREP A (OFFICE): Rapid Strep A Screen: POSITIVE — AB

## 2024-09-02 MED ORDER — AMOXICILLIN 500 MG PO CAPS: 1000.0000 mg | ORAL_CAPSULE | Freq: Every day | ORAL | 0 refills | Status: AC

## 2024-09-02 NOTE — Discharge Instructions (Signed)
 You have strep throat. Your influenza and COVID tests are negative. Stop by the pharmacy to pick up your prescriptions.  Follow up with your primary care provider or return to the urgent care, if not improving.

## 2024-09-02 NOTE — ED Triage Notes (Signed)
 Patient to Urgent Care with complaints of body aches/ ear pressure/ sore throat/.  Symptoms started yesterday. Fever last night unsure of what temp.   Taking tylenol  (last dose 1830).

## 2024-09-02 NOTE — ED Provider Notes (Signed)
 " [facility]    CSN: [id number] Arrival date & time: 09/02/24  1342      History   Chief Complaint Chief Complaint  Patient presents with   Generalized Body Aches   Sore Throat    HPI Katherine Hansen is a 39 y.o. female.   HPI  History obtained from the patient. Katherine Hansen presents for fullness in ear for the past week. Started having sore throat, nausea, decreased appetite, chills, body aches and fever last night.  Denies cough, rhinorrhea, vomiting, and diarrhea.   Took Tylenol  last night. Her co-worker was sick last week.         Past Medical History:  Diagnosis Date   Labor and delivery, indication for care 07/08/2017   Medical history non-contributory    Pilonidal cyst     Patient Active Problem List   Diagnosis Date Noted   Cervicogenic headache 09/20/2022   Somatic dysfunction of spine, cervical 09/20/2022   Obesity (BMI 30-39.9) 01/31/2021   Anxiety 03/10/2019    Past Surgical History:  Procedure Laterality Date   NO PAST SURGERIES     none      OB History     Gravida  3   Para  3   Term  3   Preterm      AB      Living  3      SAB      IAB      Ectopic      Multiple  0   Live Births  3            Home Medications    Prior to Admission medications  Medication Sig Start Date End Date Taking? Authorizing Provider  amoxicillin  (AMOXIL ) 500 MG capsule Take 2 capsules (1,000 mg total) by mouth daily for 10 days. 09/02/24 09/12/24 Yes Paityn Balsam, DO  citalopram  (CELEXA ) 10 MG tablet Take 1 tablet (10 mg total) by mouth daily. 04/03/24   Sebastian Sham, CNM  rizatriptan  (MAXALT -MLT) 10 MG disintegrating tablet Take 1 tablet earliest onset of migraine.  May repeat in 2 hours if needed. Maximum 2 tablets in 24 hours. 07/09/23   Sebastian Sham, CNM  tiZANidine  (ZANAFLEX ) 4 MG tablet Take 1 tablet (4 mg total) by mouth every 6 (six) hours as needed for muscle spasms. Patient not taking: Reported on 09/02/2024 07/09/23    Sebastian Sham, CNM    Family History Family History  Problem Relation Age of Onset   Migraines Mother    Rheum arthritis Maternal Grandmother    Migraines Maternal Grandmother    Depression Maternal Grandmother    Rashes / Skin problems Daughter        Ezcema   Depression Maternal Aunt    Alcohol abuse Father     Social History Social History[1]   Allergies   Tetracycline and Sulfamethoxazole-trimethoprim   Review of Systems Review of Systems: negative unless otherwise stated in HPI.      Physical Exam Triage Vital Signs ED Triage Vitals [09/02/24 1506]  Encounter Vitals Group     BP (!) 119/56     Girls Systolic BP Percentile      Girls Diastolic BP Percentile      Boys Systolic BP Percentile      Boys Diastolic BP Percentile      Pulse Rate (!) 112     Resp 16     Temp 99.9 F (37.7 C)     Temp Source Oral  SpO2 96 %     Weight 180 lb (81.6 kg)     Height      Head Circumference      Peak Flow      Pain Score      Pain Loc      Pain Education      Exclude from Growth Chart    No data found.  Updated Vital Signs BP (!) 119/56 (BP Location: Right Arm)   Pulse (!) 112   Temp 99.9 F (37.7 C) (Oral)   Resp 16   Wt 81.6 kg   LMP 08/09/2024   SpO2 96%   BMI 31.89 kg/m   Visual Acuity Right Eye Distance:   Left Eye Distance:   Bilateral Distance:    Right Eye Near:   Left Eye Near:    Bilateral Near:     Physical Exam GEN:     alert, non-toxic appearing female in no distress    HENT:  mucus membranes moist, oropharyngeal without lesions, moderate erythema, no tonsillar hypertrophy or exudates, nor nasal discharge, bilateral TM normal EYES:   pupils equal and reactive, no scleral injection or discharge NECK:  normal ROM, +lymphadenopathy, no meningismus   RESP:  no increased work of breathing, clear to auscultation bilaterally CVS:   regular rate and rhythm Skin:   warm and dry    UC Treatments / Results  Labs (all labs ordered  are listed, but only abnormal results are displayed) Labs Reviewed  POCT RAPID STREP A (OFFICE) - Abnormal; Notable for the following components:      Result Value   Rapid Strep A Screen Positive (*)    All other components within normal limits  POC SOFIA SARS ANTIGEN FIA - Normal  POCT INFLUENZA A/B - Normal    EKG   Radiology No results found.   Procedures Procedures (including critical care time)  Medications Ordered in UC Medications - No data to display  Initial Impression / Assessment and Plan / UC Course  I have reviewed the triage vital signs and the nursing notes.  Pertinent labs & imaging results that were available during my care of the patient were reviewed by me and considered in my medical decision making (see chart for details).       Pt is a 39 y.o. female who presents for 1-2 days of respiratory symptoms. Katherine Hansen is tachycardic with elevated temperature 99.9 F. Satting well on room air. Overall pt is non-toxic appearing, well hydrated, without respiratory distress. Pulmonary exam is unremarkable.  POC COVID and influenza obtained and were negative. POC strep is positive.  Discussed symptomatic treatment. Amoxicillin  prescribed.   Typical duration of symptoms discussed. Work note given.   Return and ED precautions given and voiced understanding. Discussed MDM, treatment plan and plan for follow-up with patient who agrees with plan.     Final Clinical Impressions(s) / UC Diagnoses   Final diagnoses:  Influenza-like symptoms  Streptococcal sore throat     Discharge Instructions      You have strep throat. Your influenza and COVID tests are negative. Stop by the pharmacy to pick up your prescriptions.  Follow up with your primary care provider or return to the urgent care, if not improving.       ED Prescriptions     Medication Sig Dispense Auth. Provider   amoxicillin  (AMOXIL ) 500 MG capsule Take 2 capsules (1,000 mg total) by mouth daily for 10  days. 20 capsule Arvella Massingale, DO  PDMP not reviewed this encounter.     [1]  Social History Tobacco Use   Smoking status: Former    Current packs/day: 0.00    Types: Cigarettes    Quit date: 07/08/2006    Years since quitting: 18.1   Smokeless tobacco: Never  Vaping Use   Vaping status: Never Used  Substance Use Topics   Alcohol use: Yes    Alcohol/week: 2.0 - 3.0 standard drinks of alcohol    Types: 2 - 3 Standard drinks or equivalent per week    Comment: not every week, occasional   Drug use: No     Kriste Berth, DO 09/02/24 1604  "

## 2024-09-03 ENCOUNTER — Ambulatory Visit: Payer: Self-pay
